# Patient Record
Sex: Female | Born: 1985 | Race: Asian | Hispanic: No | Marital: Married | Smoking: Former smoker
Health system: Southern US, Community
[De-identification: ages and names within clinical notes are randomized; demographics above are authoritative.]

## PROBLEM LIST (undated history)

## (undated) DIAGNOSIS — R569 Unspecified convulsions: Secondary | ICD-10-CM

## (undated) DIAGNOSIS — Z8619 Personal history of other infectious and parasitic diseases: Secondary | ICD-10-CM

## (undated) DIAGNOSIS — R51 Headache: Secondary | ICD-10-CM

## (undated) DIAGNOSIS — D649 Anemia, unspecified: Secondary | ICD-10-CM

## (undated) DIAGNOSIS — F32A Depression, unspecified: Secondary | ICD-10-CM

## (undated) HISTORY — DX: Depression, unspecified: F32.A

## (undated) HISTORY — DX: Anemia, unspecified: D64.9

## (undated) HISTORY — DX: Headache: R51

## (undated) HISTORY — DX: Personal history of other infectious and parasitic diseases: Z86.19

---

## 2007-03-25 ENCOUNTER — Other Ambulatory Visit: Admission: RE | Admit: 2007-03-25 | Discharge: 2007-03-25 | Payer: Self-pay | Admitting: Family Medicine

## 2009-03-11 ENCOUNTER — Emergency Department (HOSPITAL_COMMUNITY): Admission: EM | Admit: 2009-03-11 | Discharge: 2009-03-12 | Payer: Self-pay | Admitting: Emergency Medicine

## 2009-04-25 ENCOUNTER — Emergency Department (HOSPITAL_COMMUNITY): Admission: EM | Admit: 2009-04-25 | Discharge: 2009-04-26 | Payer: Self-pay | Admitting: Emergency Medicine

## 2009-05-06 ENCOUNTER — Emergency Department (HOSPITAL_COMMUNITY): Admission: EM | Admit: 2009-05-06 | Discharge: 2009-05-06 | Payer: Self-pay | Admitting: Emergency Medicine

## 2009-06-01 DIAGNOSIS — D649 Anemia, unspecified: Secondary | ICD-10-CM

## 2009-06-01 DIAGNOSIS — R569 Unspecified convulsions: Secondary | ICD-10-CM | POA: Insufficient documentation

## 2009-06-06 ENCOUNTER — Encounter (INDEPENDENT_AMBULATORY_CARE_PROVIDER_SITE_OTHER): Payer: Self-pay | Admitting: *Deleted

## 2009-06-10 ENCOUNTER — Emergency Department (HOSPITAL_COMMUNITY): Admission: EM | Admit: 2009-06-10 | Discharge: 2009-06-10 | Payer: Self-pay | Admitting: Emergency Medicine

## 2010-03-06 NOTE — Letter (Signed)
Summary: Appointment - Missed   [facility] Main [facility]  [address]   [place], [place] [address]   Phone: [phone]  Fax: [phone]     Jun 06, 2009 MRN: [id number]   Jessica Morgan DOBLER [address] [place], [place]  [address]   Dear [name],  Our records indicate you missed your appointment on 06/02/2009 with Dr. Tenny Craw. It is very important that we reach you to reschedule this appointment. We look forward to participating in your health care needs. Please contact us at the number listed above at your earliest convenience to reschedule this appointment.     Sincerely,   Migdalia Dk [facility] Scheduling Team

## 2010-04-24 LAB — PHENYTOIN LEVEL, TOTAL: Phenytoin Lvl: <2.5 ug/mL — ABNORMAL LOW (ref 10.0–20.0)

## 2010-04-24 LAB — URINALYSIS, ROUTINE W REFLEX MICROSCOPIC
Ketones, ur: NEGATIVE mg/dL
Urobilinogen, UA: 1 mg/dL (ref 0.0–1.0)

## 2010-04-24 LAB — POCT PREGNANCY, URINE: Preg Test, Ur: NEGATIVE

## 2010-04-24 LAB — URINE MICROSCOPIC-ADD ON

## 2010-04-24 LAB — GLUCOSE, CAPILLARY: Glucose-Capillary: 99 mg/dL (ref 70–99)

## 2010-04-25 LAB — DIFFERENTIAL
Eosinophils Absolute: 0.1 10*3/uL (ref 0.0–0.7)
Lymphs Abs: 1 10*3/uL (ref 0.7–4.0)
Neutrophils Relative %: 76 % (ref 43–77)

## 2010-04-25 LAB — PREGNANCY, URINE: Preg Test, Ur: NEGATIVE

## 2010-04-25 LAB — URINE CULTURE: Colony Count: >=100000

## 2010-04-25 LAB — BASIC METABOLIC PANEL
BUN: 14 mg/dL (ref 6–23)
CO2: 23 mEq/L (ref 19–32)
Calcium: 9.1 mg/dL (ref 8.4–10.5)
Chloride: 108 mEq/L (ref 96–112)
Creatinine, Ser: 0.58 mg/dL (ref 0.4–1.2)
GFR calc non Af Amer: >60 mL/min (ref >60)
Potassium: 3.8 mEq/L (ref 3.5–5.1)

## 2010-04-25 LAB — URINALYSIS, ROUTINE W REFLEX MICROSCOPIC
Bilirubin Urine: NEGATIVE
Glucose, UA: NEGATIVE mg/dL
Protein, ur: NEGATIVE mg/dL

## 2010-04-25 LAB — URINE MICROSCOPIC-ADD ON

## 2010-04-25 LAB — CBC
MCV: 79.7 fL (ref 78.0–100.0)
RDW: 14.8 % (ref 11.5–15.5)

## 2010-04-26 LAB — RAPID URINE DRUG SCREEN, HOSP PERFORMED
Benzodiazepines: NOT DETECTED
Opiates: NOT DETECTED

## 2010-04-26 LAB — URINE MICROSCOPIC-ADD ON

## 2010-04-26 LAB — CBC
HCT: 30.7 % — ABNORMAL LOW (ref 36.0–46.0)
Hemoglobin: 9.7 g/dL — ABNORMAL LOW (ref 12.0–15.0)
MCHC: 31.7 g/dL (ref 30.0–36.0)
Platelets: 199 10*3/uL (ref 150–400)
RBC: 3.87 MIL/uL (ref 3.87–5.11)

## 2010-04-26 LAB — BASIC METABOLIC PANEL
CO2: 21 mEq/L (ref 19–32)
Calcium: 8.6 mg/dL (ref 8.4–10.5)
Chloride: 104 mEq/L (ref 96–112)
GFR calc Af Amer: >60 mL/min (ref >60)
GFR calc non Af Amer: >60 mL/min (ref >60)
Glucose, Bld: 109 mg/dL — ABNORMAL HIGH (ref 70–99)
Sodium: 135 mEq/L (ref 135–145)

## 2010-04-26 LAB — URINALYSIS, ROUTINE W REFLEX MICROSCOPIC
Hgb urine dipstick: NEGATIVE
Specific Gravity, Urine: 1.014 (ref 1.005–1.030)

## 2010-04-26 LAB — DIFFERENTIAL
Lymphs Abs: 1.5 10*3/uL (ref 0.7–4.0)
Neutro Abs: 3.8 10*3/uL (ref 1.7–7.7)

## 2010-04-26 LAB — GLUCOSE, CAPILLARY: Glucose-Capillary: 121 mg/dL — ABNORMAL HIGH (ref 70–99)

## 2010-04-26 LAB — ETHANOL: Alcohol, Ethyl (B): 18 mg/dL — ABNORMAL HIGH (ref 0–10)

## 2010-04-30 LAB — POCT I-STAT, CHEM 8
Chloride: 106 mEq/L (ref 96–112)
Glucose, Bld: 91 mg/dL (ref 70–99)
HCT: 30 % — ABNORMAL LOW (ref 36.0–46.0)
Potassium: 4 mEq/L (ref 3.5–5.1)

## 2010-04-30 LAB — URINALYSIS, ROUTINE W REFLEX MICROSCOPIC
Bilirubin Urine: NEGATIVE
Nitrite: NEGATIVE
Specific Gravity, Urine: 1.022 (ref 1.005–1.030)
pH: 7.5 (ref 5.0–8.0)

## 2010-04-30 LAB — URINE MICROSCOPIC-ADD ON

## 2011-02-06 ENCOUNTER — Encounter: Payer: Self-pay | Admitting: Emergency Medicine

## 2011-02-06 ENCOUNTER — Emergency Department (HOSPITAL_COMMUNITY)
Admission: EM | Admit: 2011-02-06 | Discharge: 2011-02-06 | Disposition: A | Discharge status: Home / Self Care | Payer: Self-pay | Attending: Emergency Medicine | Admitting: Emergency Medicine

## 2011-02-06 DIAGNOSIS — F17200 Nicotine dependence, unspecified, uncomplicated: Secondary | ICD-10-CM | POA: Insufficient documentation

## 2011-02-06 DIAGNOSIS — R569 Unspecified convulsions: Secondary | ICD-10-CM | POA: Insufficient documentation

## 2011-02-06 HISTORY — DX: Unspecified convulsions: R56.9

## 2011-02-06 LAB — BASIC METABOLIC PANEL
BUN: 14 mg/dL (ref 6–23)
Calcium: 9 mg/dL (ref 8.4–10.5)
GFR calc non Af Amer: >90 mL/min (ref >90)
Glucose, Bld: 80 mg/dL (ref 70–99)

## 2011-02-06 LAB — CBC
MCH: 23.6 pg — ABNORMAL LOW (ref 26.0–34.0)
MCV: 75.4 fL — ABNORMAL LOW (ref 78.0–100.0)
Platelets: 204 10*3/uL (ref 150–400)
RDW: 14.5 % (ref 11.5–15.5)
WBC: 7 10*3/uL (ref 4.0–10.5)

## 2011-02-06 LAB — DIFFERENTIAL
Basophils Absolute: 0 10*3/uL (ref 0.0–0.1)
Basophils Relative: 0 % (ref 0–1)
Eosinophils Absolute: 0.1 10*3/uL (ref 0.0–0.7)
Eosinophils Relative: 1 % (ref 0–5)
WBC Morphology: INCREASED

## 2011-02-06 NOTE — ED Notes (Signed)
[id number]<AV> Expected date:<BR> Expected time:<BR> Means of arrival:<BR> Comments:<BR> ems/seizure

## 2011-02-06 NOTE — ED Provider Notes (Signed)
History     CSN: [id number]  Arrival date & time 02/06/11  1218   First MD Initiated Contact with Patient 02/06/11 1301      Chief Complaint  Patient presents with  . Febrile Seizure    (Consider location/radiation/quality/duration/timing/severity/associated sxs/prior treatment) Patient is a 26 y.o. female presenting with seizures. The history is provided by the patient.  Seizures  This is a recurrent problem. The current episode started less than 1 hour ago. The problem has been resolved. There was 1 seizure. The most recent episode lasted 2 to 5 minutes. Characteristics include rhythmic jerking. Characteristics do not include bladder incontinence, bit tongue or cyanosis. The episode was witnessed. There was no sensation of an aura present. The seizures did not continue in the ED. The seizure(s) had no focality. There has been no fever.    Past Medical History  Diagnosis Date  . Seizures     History reviewed. No pertinent past surgical history.  No family history on file.  History  Substance Use Topics  . Smoking status: Current Everyday Smoker -- 1.0 packs/day for 5 years    Types: Cigarettes  . Smokeless tobacco: Not on file  . Alcohol Use:      occasional    OB History    Grav Para Term Preterm Abortions TAB SAB Ect Mult Living                  Review of Systems  Cardiovascular: Negative for cyanosis.  Genitourinary: Negative for bladder incontinence.  Neurological: Positive for seizures.  All other systems reviewed and are negative.    Allergies  Review of patient's allergies indicates no known allergies.  Home Medications  No current outpatient prescriptions on file.  BP 110/61  Pulse 80  Temp(Src) 98.9 F (37.2 C) (Oral)  Resp 17  Ht 5\' 2"  (1.575 m)  Wt 153 lb (69.4 kg)  BMI 27.98 kg/m2  SpO2 100%  LMP 01/09/2011  Physical Exam  Nursing note and vitals reviewed. Constitutional: She is oriented to person, place, and time. She appears  well-developed and well-nourished. No distress.  HENT:  Head: Normocephalic and atraumatic.  Neck: Normal range of motion. Neck supple.  Cardiovascular: Normal rate and regular rhythm.  Exam reveals no gallop and no friction rub.   No murmur heard. Pulmonary/Chest: Effort normal and breath sounds normal. No respiratory distress. She has no wheezes.  Abdominal: Soft. Bowel sounds are normal. She exhibits no distension. There is no tenderness.  Musculoskeletal: Normal range of motion.  Neurological: She is alert and oriented to person, place, and time. No cranial nerve deficit. Coordination normal.  Skin: Skin is warm and dry. She is not diaphoretic.    ED Course  Procedures (including critical care time)  Labs Reviewed - No data to display No results found.   No diagnosis found.    MDM  Seizure resolved prior to arrival.  The Labs are okay.  She was on dilantin but this was stopped by a physician at [facility] several months, maybe a year ago.  It seems to me that she needs to be on a seizure medication.  She should follow up with her neurologist in the next week.  I instructed her to call tomorrow to make an appointment.        Geoffery Lyons, MD 02/06/11 [phone]

## 2011-02-06 NOTE — ED Notes (Signed)
Per EMS , pt.had grand mal seizure  At 11:45am, pt. Was found confused and responsive to painful stimuli.

## 2011-06-17 ENCOUNTER — Emergency Department (HOSPITAL_COMMUNITY)
Admission: EM | Admit: 2011-06-17 | Discharge: 2011-06-17 | Disposition: A | Discharge status: Home / Self Care | Payer: Self-pay | Attending: Emergency Medicine | Admitting: Emergency Medicine

## 2011-06-17 ENCOUNTER — Encounter (HOSPITAL_COMMUNITY): Payer: Self-pay | Admitting: Emergency Medicine

## 2011-06-17 DIAGNOSIS — R569 Unspecified convulsions: Secondary | ICD-10-CM

## 2011-06-17 DIAGNOSIS — D649 Anemia, unspecified: Secondary | ICD-10-CM | POA: Insufficient documentation

## 2011-06-17 DIAGNOSIS — G40909 Epilepsy, unspecified, not intractable, without status epilepticus: Secondary | ICD-10-CM | POA: Insufficient documentation

## 2011-06-17 LAB — POCT I-STAT, CHEM 8
BUN: 16 mg/dL (ref 6–23)
Calcium, Ion: 1.21 mmol/L (ref 1.12–1.32)
Creatinine, Ser: 0.8 mg/dL (ref 0.50–1.10)
Glucose, Bld: 84 mg/dL (ref 70–99)
TCO2: 24 mmol/L (ref 0–100)

## 2011-06-17 LAB — URINALYSIS, ROUTINE W REFLEX MICROSCOPIC
Ketones, ur: NEGATIVE mg/dL
Protein, ur: NEGATIVE mg/dL
Urobilinogen, UA: 1 mg/dL (ref 0.0–1.0)

## 2011-06-17 LAB — CBC
Hemoglobin: 8.9 g/dL — ABNORMAL LOW (ref 12.0–15.0)
MCH: 23.5 pg — ABNORMAL LOW (ref 26.0–34.0)
MCHC: 31.2 g/dL (ref 30.0–36.0)
Platelets: 138 10*3/uL — ABNORMAL LOW (ref 150–400)

## 2011-06-17 LAB — URINE MICROSCOPIC-ADD ON

## 2011-06-17 LAB — PREGNANCY, URINE: Preg Test, Ur: NEGATIVE

## 2011-06-17 LAB — DIFFERENTIAL
Basophils Relative: 0 % (ref 0–1)
Eosinophils Absolute: 0.1 10*3/uL (ref 0.0–0.7)
Eosinophils Relative: 2 % (ref 0–5)
Monocytes Relative: 4 % (ref 3–12)
Neutrophils Relative %: 71 % (ref 43–77)

## 2011-06-17 MED ORDER — POTASSIUM CHLORIDE CRYS ER 20 MEQ PO TBCR
20.0000 meq | EXTENDED_RELEASE_TABLET | Freq: Once | ORAL | Status: AC
  Administered 2011-06-17: 20 meq via ORAL
  Filled 2011-06-17: qty 1

## 2011-06-17 MED ORDER — SODIUM CHLORIDE 0.9 % IV BOLUS (SEPSIS)
1000.0000 mL | Freq: Once | INTRAVENOUS | Status: AC
  Administered 2011-06-17: 1000 mL via INTRAVENOUS

## 2011-06-17 NOTE — ED Notes (Signed)
Old and New EKG given to DR Lynelle Doctor
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
## 2011-06-17 NOTE — Discharge Instructions (Signed)
Seizure, Adult A seizure is when the body shakes uncontrollably (convulsion). It can be a scary experience. A seizure is not a diagnosis. It is a sign that something else may be wrong with brain and/or spinal cord (central nervous system). In the Emergency Department, your condition is evaluated. The seizure is then treated. You will likely need follow-up with your caregiver. You will possibly need further testing and evaluation. Your caregiver or the specialist to whom you are referred will determine if further treatment is needed. After a seizure, you may be confused, dazed and drowsy. These problems (symptoms) often follow a seizure. Medication given to treat the seizure may also cause some of these changes. The time following a seizure is known as a refractory period. Hospital admission is seldom required unless there are other conditions present such as trauma or metabolic problems. Sometimes the seizure activity follows a fainting episode. This may have been caused by a brief drop in blood pressure. These fainting (syncopal) seizures are generally not a cause for concern.  HOME CARE INSTRUCTIONS   Follow up with your caregiver as suggested.   If any problems happen, get help right away.   Do not swim or drive until your caregiver says it is okay.  Document Released: 01/19/2000 Document Revised: 01/10/2011 Document Reviewed: 01/09/2011 [facility] Patient Information 2012 [place], [place].Anemia, Frequently Asked Questions WHAT ARE THE SYMPTOMS OF ANEMIA?  Headache.   Difficulty thinking.   Fatigue.   Shortness of breath.   Weakness.   Rapid heartbeat.  AT WHAT POINT ARE PEOPLE CONSIDERED ANEMIC?  This varies with gender and age.   Both hemoglobin (Hgb) and hematocrit values are used to define anemia. These lab values are obtained from a complete blood count (CBC) test. This is performed at a caregiver's office.   The normal range of hemoglobin values for adult men is 14.0 g/dL to  16.1 g/dL. For nonpregnant women, values are 12.3 g/dL to 09.6 g/dL.   The World Health Organization defines anemia as less than 12 g/dL for nonpregnant women and less than 13 g/dL for men.   For adult males, the average normal hematocrit is 46%, and the range is 40% to 52%.   For adult females, the average normal hematocrit is 41%, and the range is 35% to 47%.   Values that fall below the lower limits can be a sign of anemia and should have further checking (evaluation).  GROUPS OF PEOPLE WHO ARE AT RISK FOR DEVELOPING ANEMIA INCLUDE:   Infants who are breastfed or taking a formula that is not fortified with iron.   Children going through a rapid growth spurt. The iron available can not keep up with the needs for a red cell mass which must grow with the child.   Women in childbearing years. They need iron because of blood loss during menstruation.   Pregnant women. The growing fetus creates a high demand for iron.   People with ongoing gastrointestinal blood loss are at risk of developing iron deficiency.   Individuals with leukemia or cancer who must receive chemotherapy or radiation to treat their disease. The drugs or radiation used to treat these diseases often decreases the bone marrow's ability to make cells of all classes. This includes red blood cells, white blood cells, and platelets.   Individuals with chronic inflammatory conditions such as rheumatoid arthritis or chronic infections.   The elderly.  ARE SOME TYPES OF ANEMIA INHERITED?   Yes, some types of anemia are due to inherited or  genetic defects.   Sickle cell anemia. This occurs most often in people of African, African American, and Mediterranean descent.   Thalassemia (or Cooley's anemia). This type is found in people of Mediterranean and Southeast Asian descent. These types of anemia are common.   [place]. This is rare.  CAN CERTAIN MEDICATIONS CAUSE A PERSON TO BECOME ANEMIC?  Yes. For example, drugs to fight  cancer (chemotherapeutic agents) often cause anemia. These drugs can slow the bone marrow's ability to make red blood cells. If there are not enough red blood cells, the body does not get enough oxygen. WHAT HEMATOCRIT LEVEL IS REQUIRED TO DONATE BLOOD?  The lower limit of an acceptable hematocrit for blood donors is 38%. If you have a low hematocrit value, you should schedule an appointment with your caregiver. ARE BLOOD TRANSFUSIONS COMMONLY USED TO CORRECT ANEMIA, AND ARE THEY DANGEROUS?  They are used to treat anemia as a last resort. Your caregiver will find the cause of the anemia and correct it if possible. Most blood transfusions are given because of excessive bleeding at the time of surgery, with trauma, or because of bone marrow suppression in patients with cancer or leukemia on chemotherapy. Blood transfusions are safer than ever before. We also know that blood transfusions affect the immune system and may increase certain risks. There is also a concern for human error. In 1/16,000 transfusions, a patient receives a transfusion of blood that is not matched with his or her blood type.  WHAT IS IRON DEFICIENCY ANEMIA AND CAN I CORRECT IT BY CHANGING MY DIET?  Iron is an essential part of hemoglobin. Without enough hemoglobin, anemia develops and the body does not get the right amount of oxygen. Iron deficiency anemia develops after the body has had a low level of iron for a long time. This is either caused by blood loss, not taking in or absorbing enough iron, or increased demands for iron (like pregnancy or rapid growth).  Foods from animal origin such as beef, chicken, and pork, are good sources of iron. Be sure to have one of these foods at each meal. Vitamin C helps your body absorb iron. Foods rich in Vitamin C include citrus, bell pepper, strawberries, spinach and cantaloupe. In some cases, iron supplements may be needed in order to correct the iron deficiency. In the case of poor absorption,  extra iron may have to be given directly into the vein through a needle (intravenously). I HAVE BEEN DIAGNOSED WITH IRON DEFICIENCY ANEMIA AND MY CAREGIVER PRESCRIBED IRON SUPPLEMENTS. HOW LONG WILL IT TAKE FOR MY BLOOD TO BECOME NORMAL?  It depends on the degree of anemia at the beginning of treatment. Most people with mild to moderate iron deficiency, anemia will correct the anemia over a period of 2 to 3 months. But after the anemia is corrected, the iron stored by the body is still low. Caregivers often suggest an additional 6 months of oral iron therapy once the anemia has been reversed. This will help prevent the iron deficiency anemia from quickly happening again. Non-anemic adult males should take iron supplements only under the direction of a doctor, too much iron can cause liver damage.  MY HEMOGLOBIN IS 9 G/DL AND I AM SCHEDULED FOR SURGERY. SHOULD I POSTPONE THE SURGERY?  If you have Hgb of 9, you should discuss this with your caregiver right away. Many patients with similar hemoglobin levels have had surgery without problems. If minimal blood loss is expected for a minor procedure, no treatment  may be necessary.  If a greater blood loss is expected for more extensive procedures, you should ask your caregiver about being treated with erythropoietin and iron. This is to accelerate the recovery of your hemoglobin to a normal level before surgery. An anemic patient who undergoes high-blood-loss surgery has a greater risk of surgical complications and need for a blood transfusion, which also carries some risk.  I HAVE BEEN TOLD THAT HEAVY MENSTRUAL PERIODS CAUSE ANEMIA. IS THERE ANYTHING I CAN DO TO PREVENT THE ANEMIA?  Anemia that results from heavy periods is usually due to iron deficiency. You can try to meet the increased demands for iron caused by the heavy monthly blood loss by increasing the intake of iron-rich foods. Iron supplements may be required. Discuss your concerns with your  caregiver. WHAT CAUSES ANEMIA DURING PREGNANCY?  Pregnancy places major demands on the body. The mother must meet the needs of both her body and her growing baby. The body needs enough iron and folate to make the right amount of red blood cells. To prevent anemia while pregnant, the mother should stay in close contact with her caregiver.  Be sure to eat a diet that has foods rich in iron and folate like liver and dark green leafy vegetables. Folate plays an important role in the normal development of a baby's spinal cord. Folate can help prevent serious disorders like spina bifida. If your diet does not provide adequate nutrients, you may want to talk with your caregiver about nutritional supplements.  WHAT IS THE RELATIONSHIP BETWEEN FIBROID TUMORS AND ANEMIA IN WOMEN?  The relationship is usually caused by the increased menstrual blood loss caused by fibroids. Good iron intake may be required to prevent iron deficiency anemia from developing.  Document Released: 08/30/2003 Document Revised: 01/10/2011 Document Reviewed: 02/13/2010 [facility] Patient Information 2012 [place], [place].

## 2011-06-17 NOTE — ED Provider Notes (Signed)
History     CSN: [id number]  Arrival date & time 06/17/11  2016   First MD Initiated Contact with Patient 06/17/11 2020      Chief Complaint  Patient presents with  . Loss of Consciousness    (Consider location/radiation/quality/duration/timing/severity/associated sxs/prior treatment) HPI Pt reports she thinks she had a seizure while at work just prior to arrival. [place] she has had similar episodes in the past but is not taking any medications. She states she was found on the floor of the bathroom but is unsure how she got there. She denies any complaints now. Denies any tongue biting or incontinence. No headache, blurry vision, nausea, vomiting or diarrhea.  No dysuria or hematuria. No vaginal complaints.  Past Medical History  Diagnosis Date  . Seizures     History reviewed. No pertinent past surgical history.  History reviewed. No pertinent family history.  History  Substance Use Topics  . Smoking status: Current Everyday Smoker -- 1.0 packs/day for 5 years    Types: Cigarettes  . Smokeless tobacco: Not on file  . Alcohol Use:      occasional    OB History    Grav Para Term Preterm Abortions TAB SAB Ect Mult Living                  Review of Systems All other systems reviewed and are negative except as noted in HPI.   Allergies  Review of patient's allergies indicates no known allergies.  Home Medications   Current Outpatient Rx  Name Route Sig Dispense Refill  . IBUPROFEN 200 MG PO TABS Oral Take 200 mg by mouth every 6 (six) hours as needed. For pain      BP 118/76  Pulse 76  Temp(Src) 99.3 F (37.4 C) (Oral)  Resp 14  SpO2 97%  Physical Exam  Nursing note and vitals reviewed. Constitutional: She is oriented to person, place, and time. She appears well-developed and well-nourished.  HENT:  Head: Normocephalic and atraumatic.  Eyes: EOM are normal. Pupils are equal, round, and reactive to light.  Neck: Normal range of motion. Neck supple.    Cardiovascular: Normal rate, normal heart sounds and intact distal pulses.   Pulmonary/Chest: Effort normal and breath sounds normal.  Abdominal: Bowel sounds are normal. She exhibits no distension. There is no tenderness.  Musculoskeletal: Normal range of motion. She exhibits no edema and no tenderness.  Neurological: She is alert and oriented to person, place, and time. She has normal strength. No cranial nerve deficit or sensory deficit.  Skin: Skin is warm and dry. No rash noted.  Psychiatric: She has a normal mood and affect.    ED Course  Procedures (including critical care time)  Labs Reviewed  CBC - Abnormal; Notable for the following:    RBC 3.78 (*)    Hemoglobin 8.9 (*)    HCT 28.5 (*)    MCV 75.4 (*)    MCH 23.5 (*)    Platelets 138 (*)    All other components within normal limits  POCT I-STAT, CHEM 8 - Abnormal; Notable for the following:    Potassium 3.3 (*)    Hemoglobin 9.5 (*)    HCT 28.0 (*)    All other components within normal limits  DIFFERENTIAL  PREGNANCY, URINE  URINALYSIS, ROUTINE W REFLEX MICROSCOPIC   No results found.   1. SEIZURE DISORDER   2. ANEMIA       MDM   Date: 06/17/2011  Rate: 87  Rhythm: normal sinus rhythm  QRS Axis: normal  Intervals: normal  ST/T Wave abnormalities: normal  Conduction Disutrbances:none  Narrative Interpretation:   Old EKG Reviewed: unchanged  Seizure in patient with reported history of same. Plan basic lab eval and if neg advised to followup with Neurologist for long term control.         Kadesia Robel B. Bernette Mayers, MD 06/17/11 2303

## 2012-02-04 ENCOUNTER — Encounter (HOSPITAL_COMMUNITY): Payer: Self-pay | Admitting: *Deleted

## 2012-02-04 ENCOUNTER — Emergency Department (HOSPITAL_COMMUNITY)
Admission: EM | Admit: 2012-02-04 | Discharge: 2012-02-04 | Disposition: A | Discharge status: Home / Self Care | Payer: Self-pay | Attending: Emergency Medicine | Admitting: Emergency Medicine

## 2012-02-04 DIAGNOSIS — F445 Conversion disorder with seizures or convulsions: Secondary | ICD-10-CM

## 2012-02-04 DIAGNOSIS — F17200 Nicotine dependence, unspecified, uncomplicated: Secondary | ICD-10-CM | POA: Insufficient documentation

## 2012-02-04 DIAGNOSIS — R569 Unspecified convulsions: Secondary | ICD-10-CM | POA: Insufficient documentation

## 2012-02-04 DIAGNOSIS — Z3202 Encounter for pregnancy test, result negative: Secondary | ICD-10-CM | POA: Insufficient documentation

## 2012-02-04 HISTORY — DX: Unspecified convulsions: R56.9

## 2012-02-04 NOTE — ED Notes (Signed)
Bed:RESA<BR> Expected date:<BR> Expected time:<BR> Means of arrival:<BR> Comments:<BR> seizure
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
## 2012-02-06 ENCOUNTER — Encounter (HOSPITAL_COMMUNITY): Payer: Self-pay | Admitting: Emergency Medicine

## 2012-02-07 NOTE — Progress Notes (Signed)
During 02/04/12 [facility] ED visit --Pt listed with no insurance coverage CM and [facility] liaison spoke with pt.  Pt offered services to assist with finding a [place] self pay provider & health reform information

## 2012-12-23 LAB — OB RESULTS CONSOLE GC/CHLAMYDIA
Chlamydia: NEGATIVE
Gonorrhea: NEGATIVE

## 2012-12-23 LAB — OB RESULTS CONSOLE ABO/RH: RH TYPE: POSITIVE

## 2012-12-23 LAB — OB RESULTS CONSOLE ANTIBODY SCREEN: ANTIBODY SCREEN: NEGATIVE

## 2012-12-23 LAB — OB RESULTS CONSOLE RUBELLA ANTIBODY, IGM: RUBELLA: IMMUNE

## 2012-12-23 LAB — OB RESULTS CONSOLE RPR: RPR: NONREACTIVE

## 2012-12-23 LAB — OB RESULTS CONSOLE HIV ANTIBODY (ROUTINE TESTING): HIV: NONREACTIVE

## 2012-12-23 LAB — OB RESULTS CONSOLE HEPATITIS B SURFACE ANTIGEN: HEP B S AG: NEGATIVE

## 2013-02-04 NOTE — L&D Delivery Note (Signed)
Delivery Note At 8:29 PM a viable female was delivered via Vaginal, Spontaneous Delivery (Presentation: Left Occiput Anterior).  APGAR: 7, 9; weight pending.   Placenta status: Intact, .  Cord: 3 vessels with the following complications: None.  Anesthesia: Epidural  Episiotomy: None Lacerations: 1st degree Suture Repair: 3.0 vicryl rapide Est. Blood Loss (mL): 500  Mom to postpartum.  Baby to Couplet care / Skin to Skin.  Rhyse Loux 06/02/2013, 8:45 PM

## 2013-05-05 LAB — OB RESULTS CONSOLE GBS: GBS: NEGATIVE

## 2013-05-21 ENCOUNTER — Encounter (HOSPITAL_COMMUNITY): Payer: Self-pay | Admitting: *Deleted

## 2013-05-21 ENCOUNTER — Telehealth (HOSPITAL_COMMUNITY): Payer: Self-pay | Admitting: *Deleted

## 2013-05-21 NOTE — Telephone Encounter (Signed)
Preadmission screen  

## 2013-06-02 ENCOUNTER — Inpatient Hospital Stay (HOSPITAL_COMMUNITY): Payer: Medicaid Other | Admitting: Anesthesiology

## 2013-06-02 ENCOUNTER — Encounter (HOSPITAL_COMMUNITY): Payer: Self-pay

## 2013-06-02 ENCOUNTER — Inpatient Hospital Stay (HOSPITAL_COMMUNITY)
Admission: RE | Admit: 2013-06-02 | Discharge: 2013-06-04 | DRG: 775 | Disposition: A | Discharge status: Home / Self Care | Payer: Medicaid Other | Source: Ambulatory Visit | Attending: Obstetrics and Gynecology | Admitting: Obstetrics and Gynecology

## 2013-06-02 ENCOUNTER — Encounter (HOSPITAL_COMMUNITY): Payer: Medicaid Other | Admitting: Anesthesiology

## 2013-06-02 DIAGNOSIS — D649 Anemia, unspecified: Secondary | ICD-10-CM | POA: Diagnosis present

## 2013-06-02 DIAGNOSIS — O9902 Anemia complicating childbirth: Secondary | ICD-10-CM | POA: Diagnosis present

## 2013-06-02 DIAGNOSIS — Z3400 Encounter for supervision of normal first pregnancy, unspecified trimester: Secondary | ICD-10-CM

## 2013-06-02 DIAGNOSIS — O99334 Smoking (tobacco) complicating childbirth: Secondary | ICD-10-CM | POA: Diagnosis present

## 2013-06-02 LAB — RPR

## 2013-06-02 LAB — CBC
HCT: 26.4 % — ABNORMAL LOW (ref 36.0–46.0)
HEMOGLOBIN: 8.1 g/dL — AB (ref 12.0–15.0)
MCH: 25.6 pg — AB (ref 26.0–34.0)
MCHC: 30.7 g/dL (ref 30.0–36.0)
MCV: 83.5 fL (ref 78.0–100.0)
Platelets: 120 10*3/uL — ABNORMAL LOW (ref 150–400)
RBC: 3.16 MIL/uL — ABNORMAL LOW (ref 3.87–5.11)
RDW: 14.5 % (ref 11.5–15.5)
WBC: 9 10*3/uL (ref 4.0–10.5)

## 2013-06-02 LAB — TYPE AND SCREEN
ABO/RH(D): B POS
Antibody Screen: NEGATIVE

## 2013-06-02 LAB — ABO/RH: ABO/RH(D): B POS

## 2013-06-02 MED ORDER — OXYTOCIN 40 UNITS IN LACTATED RINGERS INFUSION - SIMPLE MED: 62.5000 mL/h | INTRAVENOUS | Status: DC

## 2013-06-02 MED ORDER — LACTATED RINGERS IV SOLN
500.0000 mL | Freq: Once | INTRAVENOUS | Status: AC
  Administered 2013-06-02: 500 mL via INTRAVENOUS

## 2013-06-02 MED ORDER — METHYLERGONOVINE MALEATE 0.2 MG/ML IJ SOLN: 0.2000 mg | INTRAMUSCULAR | Status: DC | PRN

## 2013-06-02 MED ORDER — FENTANYL 2.5 MCG/ML BUPIVACAINE 1/10 % EPIDURAL INFUSION (WH - ANES)
INTRAMUSCULAR | Status: AC
  Filled 2013-06-02: qty 125

## 2013-06-02 MED ORDER — SENNOSIDES-DOCUSATE SODIUM 8.6-50 MG PO TABS
2.0000 | ORAL_TABLET | ORAL | Status: DC
  Administered 2013-06-02 – 2013-06-03 (×2): 2 via ORAL
  Filled 2013-06-02 (×2): qty 2

## 2013-06-02 MED ORDER — ONDANSETRON HCL 4 MG PO TABS: 4.0000 mg | ORAL_TABLET | ORAL | Status: DC | PRN

## 2013-06-02 MED ORDER — OXYCODONE-ACETAMINOPHEN 5-325 MG PO TABS: 1.0000 | ORAL_TABLET | ORAL | Status: DC | PRN

## 2013-06-02 MED ORDER — PHENYLEPHRINE 40 MCG/ML (10ML) SYRINGE FOR IV PUSH (FOR BLOOD PRESSURE SUPPORT)
80.0000 ug | PREFILLED_SYRINGE | INTRAVENOUS | Status: DC | PRN
  Filled 2013-06-02: qty 10
  Filled 2013-06-02: qty 2

## 2013-06-02 MED ORDER — WITCH HAZEL-GLYCERIN EX PADS: 1.0000 "application " | MEDICATED_PAD | CUTANEOUS | Status: DC | PRN

## 2013-06-02 MED ORDER — OXYTOCIN 40 UNITS IN LACTATED RINGERS INFUSION - SIMPLE MED: 1.0000 m[IU]/min | INTRAVENOUS | Status: DC

## 2013-06-02 MED ORDER — CITRIC ACID-SODIUM CITRATE 334-500 MG/5ML PO SOLN: 30.0000 mL | ORAL | Status: DC | PRN

## 2013-06-02 MED ORDER — PHENYLEPHRINE 40 MCG/ML (10ML) SYRINGE FOR IV PUSH (FOR BLOOD PRESSURE SUPPORT)
PREFILLED_SYRINGE | INTRAVENOUS | Status: AC
  Filled 2013-06-02: qty 10

## 2013-06-02 MED ORDER — OXYTOCIN 40 UNITS IN LACTATED RINGERS INFUSION - SIMPLE MED
1.0000 m[IU]/min | INTRAVENOUS | Status: DC
  Administered 2013-06-02: 1 m[IU]/min via INTRAVENOUS

## 2013-06-02 MED ORDER — FENTANYL 2.5 MCG/ML BUPIVACAINE 1/10 % EPIDURAL INFUSION (WH - ANES)
14.0000 mL/h | INTRAMUSCULAR | Status: DC | PRN
  Filled 2013-06-02: qty 125

## 2013-06-02 MED ORDER — ACETAMINOPHEN 325 MG PO TABS: 650.0000 mg | ORAL_TABLET | ORAL | Status: DC | PRN

## 2013-06-02 MED ORDER — LIDOCAINE HCL (PF) 1 % IJ SOLN
INTRAMUSCULAR | Status: DC | PRN
  Administered 2013-06-02 (×2): 5 mL

## 2013-06-02 MED ORDER — OXYTOCIN 40 UNITS IN LACTATED RINGERS INFUSION - SIMPLE MED
1.0000 m[IU]/min | INTRAVENOUS | Status: DC
  Administered 2013-06-02: 2 m[IU]/min via INTRAVENOUS
  Filled 2013-06-02: qty 1000

## 2013-06-02 MED ORDER — LACTATED RINGERS IV SOLN
INTRAVENOUS | Status: DC
  Administered 2013-06-02: 250 mL via INTRAUTERINE

## 2013-06-02 MED ORDER — BENZOCAINE-MENTHOL 20-0.5 % EX AERO
1.0000 "application " | INHALATION_SPRAY | CUTANEOUS | Status: DC | PRN
  Administered 2013-06-02: 1 via TOPICAL
  Filled 2013-06-02: qty 56

## 2013-06-02 MED ORDER — OXYTOCIN BOLUS FROM INFUSION: 500.0000 mL | INTRAVENOUS | Status: DC

## 2013-06-02 MED ORDER — ZOLPIDEM TARTRATE 5 MG PO TABS: 5.0000 mg | ORAL_TABLET | Freq: Every evening | ORAL | Status: DC | PRN

## 2013-06-02 MED ORDER — EPHEDRINE 5 MG/ML INJ
10.0000 mg | INTRAVENOUS | Status: DC | PRN
  Filled 2013-06-02: qty 2

## 2013-06-02 MED ORDER — IBUPROFEN 600 MG PO TABS
600.0000 mg | ORAL_TABLET | Freq: Four times a day (QID) | ORAL | Status: DC
  Administered 2013-06-03 – 2013-06-04 (×6): 600 mg via ORAL
  Filled 2013-06-02 (×6): qty 1

## 2013-06-02 MED ORDER — ONDANSETRON HCL 4 MG/2ML IJ SOLN
4.0000 mg | Freq: Four times a day (QID) | INTRAMUSCULAR | Status: DC | PRN
  Administered 2013-06-02: 4 mg via INTRAVENOUS

## 2013-06-02 MED ORDER — TERBUTALINE SULFATE 1 MG/ML IJ SOLN: 0.2500 mg | Freq: Once | INTRAMUSCULAR | Status: DC | PRN

## 2013-06-02 MED ORDER — LACTATED RINGERS IV SOLN
INTRAVENOUS | Status: DC
  Administered 2013-06-02 (×3): via INTRAVENOUS

## 2013-06-02 MED ORDER — MEASLES, MUMPS & RUBELLA VAC ~~LOC~~ INJ: 0.5000 mL | INJECTION | Freq: Once | SUBCUTANEOUS | Status: DC

## 2013-06-02 MED ORDER — TETANUS-DIPHTH-ACELL PERTUSSIS 5-2.5-18.5 LF-MCG/0.5 IM SUSP
0.5000 mL | Freq: Once | INTRAMUSCULAR | Status: AC
  Administered 2013-06-03: 0.5 mL via INTRAMUSCULAR
  Filled 2013-06-02: qty 0.5

## 2013-06-02 MED ORDER — MAGNESIUM HYDROXIDE 400 MG/5ML PO SUSP: 30.0000 mL | ORAL | Status: DC | PRN

## 2013-06-02 MED ORDER — LIDOCAINE HCL (PF) 1 % IJ SOLN
30.0000 mL | INTRAMUSCULAR | Status: DC | PRN
  Filled 2013-06-02: qty 30

## 2013-06-02 MED ORDER — ONDANSETRON HCL 4 MG/2ML IJ SOLN: 4.0000 mg | INTRAMUSCULAR | Status: DC | PRN

## 2013-06-02 MED ORDER — METHYLERGONOVINE MALEATE 0.2 MG PO TABS: 0.2000 mg | ORAL_TABLET | ORAL | Status: DC | PRN

## 2013-06-02 MED ORDER — LACTATED RINGERS IV SOLN
500.0000 mL | INTRAVENOUS | Status: DC | PRN
  Administered 2013-06-02: 500 mL via INTRAVENOUS

## 2013-06-02 MED ORDER — DIPHENHYDRAMINE HCL 25 MG PO CAPS: 25.0000 mg | ORAL_CAPSULE | Freq: Four times a day (QID) | ORAL | Status: DC | PRN

## 2013-06-02 MED ORDER — DIPHENHYDRAMINE HCL 50 MG/ML IJ SOLN: 12.5000 mg | INTRAMUSCULAR | Status: DC | PRN

## 2013-06-02 MED ORDER — FENTANYL 2.5 MCG/ML BUPIVACAINE 1/10 % EPIDURAL INFUSION (WH - ANES)
14.0000 mL/h | INTRAMUSCULAR | Status: DC | PRN
  Administered 2013-06-02 (×2): 14 mL/h via EPIDURAL

## 2013-06-02 MED ORDER — SODIUM BICARBONATE 8.4 % IV SOLN
INTRAVENOUS | Status: DC | PRN
  Administered 2013-06-02: 5 mL via EPIDURAL

## 2013-06-02 MED ORDER — DIBUCAINE 1 % RE OINT
1.0000 "application " | TOPICAL_OINTMENT | RECTAL | Status: DC | PRN
  Filled 2013-06-02: qty 28

## 2013-06-02 MED ORDER — LANOLIN HYDROUS EX OINT: TOPICAL_OINTMENT | CUTANEOUS | Status: DC | PRN

## 2013-06-02 MED ORDER — IBUPROFEN 600 MG PO TABS
600.0000 mg | ORAL_TABLET | Freq: Four times a day (QID) | ORAL | Status: DC | PRN
  Administered 2013-06-02: 600 mg via ORAL
  Filled 2013-06-02: qty 1

## 2013-06-02 MED ORDER — PRENATAL MULTIVITAMIN CH
1.0000 | ORAL_TABLET | Freq: Every day | ORAL | Status: DC
  Administered 2013-06-03: 1 via ORAL
  Filled 2013-06-02 (×2): qty 1

## 2013-06-02 MED ORDER — SIMETHICONE 80 MG PO CHEW
80.0000 mg | CHEWABLE_TABLET | ORAL | Status: DC | PRN
Start: 2013-06-02 — End: 2013-06-04

## 2013-06-02 MED ORDER — PHENYLEPHRINE 40 MCG/ML (10ML) SYRINGE FOR IV PUSH (FOR BLOOD PRESSURE SUPPORT)
80.0000 ug | PREFILLED_SYRINGE | INTRAVENOUS | Status: DC | PRN
  Filled 2013-06-02: qty 2

## 2013-06-02 MED ORDER — EPHEDRINE 5 MG/ML INJ
INTRAVENOUS | Status: AC
  Filled 2013-06-02: qty 4

## 2013-06-02 NOTE — Progress Notes (Signed)
Comfortable with epidural Afeb, VSS FHT- Cat II, recurrent variable decels, ctx q 2-3 min on 8 mu/min pitocin VE-7-8/80/0, vtx, IUPC and FSE placed Will decrease pitocin and try amnioinfusion for variable decels, making good progress

## 2013-06-02 NOTE — Progress Notes (Signed)
After MD reviewed strip, called me and instructed to leave Pitocin on for now. He is coming to place internal monitors.

## 2013-06-02 NOTE — Anesthesia Procedure Notes (Signed)
Epidural Patient location during procedure: OB Start time: 06/02/2013 9:00 AM  Staffing Anesthesiologist: Brayton CavesJACKSON, Dawn Convery Performed by: anesthesiologist   Preanesthetic Checklist Completed: patient identified, site marked, surgical consent, pre-op evaluation, timeout performed, IV checked, risks and benefits discussed and monitors and equipment checked  Epidural Patient position: sitting Prep: site prepped and draped and DuraPrep Patient monitoring: continuous pulse ox and blood pressure Approach: midline Location: L3-L4 Injection technique: LOR air  Needle:  Needle type: Tuohy  Needle gauge: 17 G Needle length: 9 cm and 9 Needle insertion depth: 5 cm cm Catheter type: closed end flexible Catheter size: 19 Gauge Catheter at skin depth: 10 cm Test dose: negative  Assessment Events: blood not aspirated, injection not painful, no injection resistance, negative IV test and no paresthesia  Additional Notes Patient identified.  Risk benefits discussed including failed block, incomplete pain control, headache, nerve damage, paralysis, blood pressure changes, nausea, vomiting, reactions to medication both toxic or allergic, and postpartum back pain.  Patient expressed understanding and wished to proceed.  All questions were answered.  Sterile technique used throughout procedure and epidural site dressed with sterile barrier dressing. No paresthesia or other complications noted.The patient did not experience any signs of intravascular injection such as tinnitus or metallic taste in mouth nor signs of intrathecal spread such as rapid motor block. Please see nursing notes for vital signs.

## 2013-06-02 NOTE — H&P (Signed)
Jessica KaufmanSarem Morgan is a 28 y.o. female, G1P0, EGA 39+ weeks with EDC 5-1 presenting for elective induction with favorable cervix.  Prenatal care uncomplicated, see prenatal records for complete history.  Maternal Medical History:  Fetal activity: Perceived fetal activity is normal.    Prenatal complications: no prenatal complications   OB History   Grav Para Term Preterm Abortions TAB SAB Ect Mult Living   1              Past Medical History  Diagnosis Date  . Seizures   . Seizure   . Hx of chlamydia infection   . Headache(784.0)    History reviewed. No pertinent past surgical history. Family History: family history is not on file. Social History:  reports that she has been smoking Cigarettes.  She has a 5 pack-year smoking history. She does not have any smokeless tobacco history on file. She reports that she does not drink alcohol or use illicit drugs.   Prenatal Transfer Tool  Maternal Diabetes: No Genetic Screening: Normal Maternal Ultrasounds/Referrals: Normal Fetal Ultrasounds or other Referrals:  None Maternal Substance Abuse:  No Significant Maternal Medications:  None Significant Maternal Lab Results:  Lab values include: Group B Strep negative Other Comments:  None  Review of Systems  Respiratory: Negative.   Cardiovascular: Negative.    AROM-clear Dilation: 4 Effacement (%): 70 Station: -1 Exam by:: Dr. Jackelyn KnifeMeisinger Blood pressure 99/53, pulse 98, temperature 98.4 F (36.9 C), temperature source Oral, resp. rate 22, height 5\' 1"  (1.549 m), weight 83.462 kg (184 lb), last menstrual period 08/27/2012. Maternal Exam:  Uterine Assessment: Contraction strength is mild.  Contraction frequency is irregular.   Abdomen: Patient reports no abdominal tenderness. Estimated fetal weight is 7 1/2 lbs.   Fetal presentation: vertex  Introitus: Normal vulva. Normal vagina.  Amniotic fluid character: clear.  Pelvis: adequate for delivery.   Cervix: Cervix evaluated by digital  exam.     Fetal Exam Fetal Monitor Review: Mode: ultrasound.   Baseline rate: 140.  Variability: moderate (6-25 bpm).   Pattern: accelerations present and no decelerations.    Fetal State Assessment: Category I - tracings are normal.     Physical Exam  Constitutional: She appears well-developed and well-nourished.  Cardiovascular: Normal rate, regular rhythm and normal heart sounds.   No murmur heard. Respiratory: Effort normal and breath sounds normal. No respiratory distress. She has no wheezes.  GI: Soft.  gravid    Prenatal labs: ABO, Rh: --/--/B POS (04/29 16100640) Antibody: PENDING (04/29 0640) Rubella: Immune (11/19 0000) RPR: Nonreactive (11/19 0000)  HBsAg: Negative (11/19 0000)  HIV: Non-reactive (11/19 0000)  GBS: Negative (04/01 0000)  GCT:  107  Assessment/Plan: IUP at 39+ weeks with favorable cervix for induction.  On pitocin, AROM done, monitor progress.   Jessica Morgan 06/02/2013, 8:09 AM

## 2013-06-02 NOTE — Progress Notes (Signed)
Notified MD of moderate variability, occasional late, and MVU's. Order given to restart pitocin.

## 2013-06-02 NOTE — Anesthesia Preprocedure Evaluation (Signed)
Anesthesia Evaluation  Patient identified by MRN, date of birth, ID band Patient awake    Reviewed: Allergy & Precautions, H&P , Patient's Chart, lab work & pertinent test results  Airway Mallampati: III TM Distance: >3 FB Neck ROM: full    Dental   Pulmonary Current Smoker,  breath sounds clear to auscultation        Cardiovascular Rhythm:regular Rate:Normal     Neuro/Psych  Headaches, Seizures -,     GI/Hepatic   Endo/Other    Renal/GU      Musculoskeletal   Abdominal   Peds  Hematology  (+) anemia ,   Anesthesia Other Findings   Reproductive/Obstetrics (+) Pregnancy                           Anesthesia Physical Anesthesia Plan  ASA: II  Anesthesia Plan: Epidural   Post-op Pain Management:    Induction:   Airway Management Planned:   Additional Equipment:   Intra-op Plan:   Post-operative Plan:   Informed Consent: I have reviewed the patients History and Physical, chart, labs and discussed the procedure including the risks, benefits and alternatives for the proposed anesthesia with the patient or authorized representative who has indicated his/her understanding and acceptance.     Plan Discussed with:   Anesthesia Plan Comments:         Anesthesia Quick Evaluation

## 2013-06-02 NOTE — Lactation Note (Signed)
This note was copied from the chart of Jessica Morgan. Lactation Consultation Note  Patient Name: Jessica Rosalee KaufmanSarem Marotto [id number]'XToday's Date: 06/02/2013 Reason for consult: Other (Comment) (charting for exclusion)   Maternal Data Formula Feeding for Exclusion: Yes Reason for exclusion: Mother's choice to formula feed on admision  Feeding    LATCH Score/Interventions                      Lactation Tools Discussed/Used     Consult Status Consult Status: Complete    Zara ChessJoanne P Demica Zook 06/02/2013, 9:01 PM

## 2013-06-03 NOTE — Anesthesia Postprocedure Evaluation (Signed)
  Anesthesia Post-op Note  Patient: Rosalee KaufmanSarem Ospina  Procedure(s) Performed: * No procedures listed *  Patient Location: Mother/Baby  Anesthesia Type:Epidural  Level of Consciousness: awake, alert , oriented and patient cooperative  Airway and Oxygen Therapy: Patient Spontanous Breathing  Post-op Pain: mild  Post-op Assessment: Patient's Cardiovascular Status Stable, Respiratory Function Stable, No headache, No backache, No residual numbness and No residual motor weakness  Post-op Vital Signs: stable  Last Vitals:  Filed Vitals:   06/03/13 0350  BP: 90/48  Pulse: 101  Temp: 36.7 C  Resp: 18    Complications: No apparent anesthesia complications

## 2013-06-03 NOTE — Progress Notes (Signed)
PPD #1 No problems Afeb, VSS Fundus firm, NT at U-1 Continue routine postpartum care 

## 2013-06-04 MED ORDER — IBUPROFEN 600 MG PO TABS: 600.0000 mg | ORAL_TABLET | Freq: Four times a day (QID) | ORAL | Status: DC

## 2013-06-04 NOTE — Lactation Note (Signed)
This note was copied from the chart of Boy Koren Diodato. Lactation Consultation Note  Patient Name: Boy Rosalee KaufmanSarem Hornbaker ZOXWR'UToday's Date: 06/04/2013 Reason for consult: Follow-up assessment Follow-up assessment baby 37 hours of life. Mom is not sure if she is going to continue to BF, but she is primarily offering bottles. Reviewed engorgement prevention/treatment, both whether she intends to keep producing milk or not. Enc mom to put baby to breast if she wants to nurse. Discussed supply/demand if she is wanting to continue producing BM. Mom not interested in a DEBP. Mom aware of OP/BFSG services, community services, and LC number. Mom enc to call if needs assistance.  Maternal Data    Feeding Feeding Type:  (Mom is not sure if she is going to continue to attempt to BF. But she is offering primarily bottles. )  LATCH Score/Interventions                      Lactation Tools Discussed/Used     Consult Status Consult Status: Complete    Sherlyn HayJennifer D Emojean Gertz 06/04/2013, 10:09 AM

## 2013-06-04 NOTE — Discharge Instructions (Signed)
As per discharge pamphlet °

## 2013-06-04 NOTE — Progress Notes (Signed)
PPD #2 Doing well, trying to breastfeed Afeb, VSS D/c home

## 2013-06-04 NOTE — Lactation Note (Signed)
This note was copied from the chart of Jessica Morgan. Lactation Consultation Note Mom had been bottle feeding baby for over 24 hrs. Decided she might want to BF. Called into rm. Nipples short shaft, erect but compresses inwards. Hand pump used w/+ colostrum.  #20 NS demonstrated and applied. Baby fussy, formula squired into NS w/curve tip syring. Latched on well. Encouraged mom to support breast w/"C" hold. Baby in football position. Mom stated couldn't she just pump on bottle feed. Stated yes. DEBP taken into rm and set up. Pumped for 15 min. 1ml of colostrum given to baby in curve tip syring. Mom encouraged to feed baby 8-12 times/24 hours and with feeding cues. Specifics of an asymmetric latch shown. Reviewed Baby & Me book's Breastfeeding Basics. Mom encouraged to feed baby w/feeding cues. Mom shown how to use DEBP & how to disassemble, clean, & reassemble parts.Encouraged to call for assistance if needed and to verify proper latch.Hand expression taught to Mom. Mom made aware of O/P services, breastfeeding support groups, community resources, and our phone # for post-discharge questions. Mother informed of post-discharge support and given phone number to the lactation department, including services for phone call assistance; out-patient appointments; and breastfeeding support group. List of other breastfeeding resources in the community given in the handout. Encouraged mother to call for problems or concerns related to breastfeeding. Patient Name: Jessica Rosalee KaufmanSarem Bacigalupi ZOXWR'UToday's Date: 06/04/2013 Reason for consult: Follow-up assessment;Difficult latch   Maternal Data Does the patient have breastfeeding experience prior to this delivery?: No  Feeding Feeding Type: Breast Fed  LATCH Score/Interventions Latch: Repeated attempts needed to sustain latch, nipple held in mouth throughout feeding, stimulation needed to elicit sucking reflex. Intervention(s): Adjust position;Assist with latch;Breast  massage;Breast compression  Audible Swallowing: A few with stimulation Intervention(s): Hand expression;Skin to skin Intervention(s): Hand expression;Skin to skin  Type of Nipple: Everted at rest and after stimulation (compresses inwards, )  Comfort (Breast/Nipple): Soft / non-tender     Hold (Positioning): Assistance needed to correctly position infant at breast and maintain latch. Intervention(s): Breastfeeding basics reviewed;Support Pillows;Position options;Skin to skin  LATCH Score: 7  Lactation Tools Discussed/Used Tools: Shells;Nipple Dorris CarnesShields;Pump Nipple shield size: 20;24 Shell Type: Inverted Breast pump type: Double-Electric Breast Pump WIC Program: Yes   Consult Status Consult Status: Follow-up Date: 06/04/13 Follow-up type: In-patient    Charyl DancerLaura G Petrona Wyeth 06/04/2013, 1:44 AM

## 2013-06-04 NOTE — Discharge Summary (Signed)
Obstetric Discharge Summary Reason for Admission: induction of labor Prenatal Procedures: none Intrapartum Procedures: spontaneous vaginal delivery Postpartum Procedures: none Complications-Operative and Postpartum: 1st degree perineal laceration Hemoglobin  Date Value Ref Range Status  06/02/2013 8.1* 12.0 - 15.0 g/dL Final     HCT  Date Value Ref Range Status  06/02/2013 26.4* 36.0 - 46.0 % Final    Physical Exam:  General: alert Lochia: appropriate Uterine Fundus: firm  Discharge Diagnoses: Term Pregnancy-delivered  Discharge Information: Date: 06/04/2013 Activity: pelvic rest Diet: routine Medications: Ibuprofen Condition: stable Instructions: refer to practice specific booklet Discharge to: home Follow-up Information   Follow up with Lenay Lovejoy D, MD. Schedule an appointment as soon as possible for a visit in 6 weeks.   Specialty:  Obstetrics and Gynecology   Contact information:   [address], [address] [place] [address] [phone]       Newborn Data: Live born female  Birth Weight: 6 lb 15.3 oz (3155 g) APGAR: 7, 9  Home with mother.  Taylee Gunnells 06/04/2013, 8:29 AM

## 2013-06-04 NOTE — Progress Notes (Signed)
UR chart review completed.  

## 2013-12-06 ENCOUNTER — Encounter (HOSPITAL_COMMUNITY): Payer: Self-pay

## 2015-01-31 ENCOUNTER — Emergency Department (HOSPITAL_COMMUNITY)
Admission: EM | Admit: 2015-01-31 | Discharge: 2015-01-31 | Disposition: A | Discharge status: Home / Self Care | Payer: Medicaid Other | Attending: Emergency Medicine | Admitting: Emergency Medicine

## 2015-01-31 ENCOUNTER — Encounter (HOSPITAL_COMMUNITY): Payer: Self-pay | Admitting: Vascular Surgery

## 2015-01-31 DIAGNOSIS — Z8619 Personal history of other infectious and parasitic diseases: Secondary | ICD-10-CM | POA: Insufficient documentation

## 2015-01-31 DIAGNOSIS — Z3202 Encounter for pregnancy test, result negative: Secondary | ICD-10-CM | POA: Insufficient documentation

## 2015-01-31 DIAGNOSIS — Z87891 Personal history of nicotine dependence: Secondary | ICD-10-CM | POA: Insufficient documentation

## 2015-01-31 DIAGNOSIS — R55 Syncope and collapse: Secondary | ICD-10-CM

## 2015-01-31 LAB — CBC
HCT: 32 % — ABNORMAL LOW (ref 36.0–46.0)
Hemoglobin: 10.1 g/dL — ABNORMAL LOW (ref 12.0–15.0)
MCH: 24.4 pg — ABNORMAL LOW (ref 26.0–34.0)
MCHC: 31.6 g/dL (ref 30.0–36.0)
MCV: 77.3 fL — ABNORMAL LOW (ref 78.0–100.0)
Platelets: 145 10*3/uL — ABNORMAL LOW (ref 150–400)
RBC: 4.14 MIL/uL (ref 3.87–5.11)
RDW: 15.2 % (ref 11.5–15.5)
WBC: 6.9 10*3/uL (ref 4.0–10.5)

## 2015-01-31 LAB — RAPID URINE DRUG SCREEN, HOSP PERFORMED
Amphetamines: NOT DETECTED
Barbiturates: NOT DETECTED
Benzodiazepines: NOT DETECTED
Cocaine: NOT DETECTED
Opiates: NOT DETECTED
TETRAHYDROCANNABINOL: NOT DETECTED

## 2015-01-31 LAB — BASIC METABOLIC PANEL
Anion gap: 8 (ref 5–15)
BUN: 16 mg/dL (ref 6–20)
CALCIUM: 9.1 mg/dL (ref 8.9–10.3)
CO2: 26 mmol/L (ref 22–32)
CREATININE: 0.71 mg/dL (ref 0.44–1.00)
Chloride: 104 mmol/L (ref 101–111)
GFR calc Af Amer: >60 mL/min (ref >60)
GFR calc non Af Amer: >60 mL/min (ref >60)
Glucose, Bld: 95 mg/dL (ref 65–99)
Potassium: 4 mmol/L (ref 3.5–5.1)
Sodium: 138 mmol/L (ref 135–145)

## 2015-01-31 LAB — URINALYSIS, ROUTINE W REFLEX MICROSCOPIC
Bilirubin Urine: NEGATIVE
GLUCOSE, UA: NEGATIVE mg/dL
Hgb urine dipstick: NEGATIVE
Ketones, ur: NEGATIVE mg/dL
LEUKOCYTES UA: NEGATIVE
NITRITE: POSITIVE — AB
PROTEIN: NEGATIVE mg/dL
Specific Gravity, Urine: 1.013 (ref 1.005–1.030)
pH: 6 (ref 5.0–8.0)

## 2015-01-31 LAB — I-STAT BETA HCG BLOOD, ED (MC, WL, AP ONLY): I-stat hCG, quantitative: <5 m[IU]/mL (ref <5)

## 2015-01-31 LAB — URINE MICROSCOPIC-ADD ON

## 2015-01-31 NOTE — Discharge Instructions (Signed)
We saw you in the ER for the unresponsive episode. All the results in the ER are normal. We are not sure what is causing your symptoms. The workup in the ER is not complete, and is limited to screening for life threatening and emergent conditions only, so please see a primary care doctor for further evaluation.   Syncope Syncope is a medical term for fainting or passing out. This means you lose consciousness and drop to the ground. People are generally unconscious for less than 5 minutes. You may have some muscle twitches for up to 15 seconds before waking up and returning to normal. Syncope occurs more often in older adults, but it can happen to anyone. While most causes of syncope are not dangerous, syncope can be a sign of a serious medical problem. It is important to seek medical care.  CAUSES  Syncope is caused by a sudden drop in blood flow to the brain. The specific cause is often not determined. Factors that can bring on syncope include:  Taking medicines that lower blood pressure.  Sudden changes in posture, such as standing up quickly.  Taking more medicine than prescribed.  Standing in one place for too long.  Seizure disorders.  Dehydration and excessive exposure to heat.  Low blood sugar (hypoglycemia).  Straining to have a bowel movement.  Heart disease, irregular heartbeat, or other circulatory problems.  Fear, emotional distress, seeing blood, or severe pain. SYMPTOMS  Right before fainting, you may:  Feel dizzy or light-headed.  Feel nauseous.  See all white or all black in your field of vision.  Have cold, clammy skin. DIAGNOSIS  Your health care provider will ask about your symptoms, perform a physical exam, and perform an electrocardiogram (ECG) to record the electrical activity of your heart. Your health care provider may also perform other heart or blood tests to determine the cause of your syncope which may include:  Transthoracic echocardiogram  (TTE). During echocardiography, sound waves are used to evaluate how blood flows through your heart.  Transesophageal echocardiogram (TEE).  Cardiac monitoring. This allows your health care provider to monitor your heart rate and rhythm in real time.  Holter monitor. This is a portable device that records your heartbeat and can help diagnose heart arrhythmias. It allows your health care provider to track your heart activity for several days, if needed.  Stress tests by exercise or by giving medicine that makes the heart beat faster. TREATMENT  In most cases, no treatment is needed. Depending on the cause of your syncope, your health care provider may recommend changing or stopping some of your medicines. HOME CARE INSTRUCTIONS  Have someone stay with you until you feel stable.  Do not drive, use machinery, or play sports until your health care provider says it is okay.  Keep all follow-up appointments as directed by your health care provider.  Lie down right away if you start feeling like you might faint. Breathe deeply and steadily. Wait until all the symptoms have passed.  Drink enough fluids to keep your urine clear or pale yellow.  If you are taking blood pressure or heart medicine, get up slowly and take several minutes to sit and then stand. This can reduce dizziness. SEEK IMMEDIATE MEDICAL CARE IF:   You have a severe headache.  You have unusual pain in the chest, abdomen, or back.  You are bleeding from your mouth or rectum, or you have black or tarry stool.  You have an irregular or very fast  heartbeat.  You have pain with breathing.  You have repeated fainting or seizure-like jerking during an episode.  You faint when sitting or lying down.  You have confusion.  You have trouble walking.  You have severe weakness.  You have vision problems. If you fainted, call your local emergency services (911 in [place] Do not drive yourself to the hospital.    This  information is not intended to replace advice given to you by your health care provider. Make sure you discuss any questions you have with your health care provider.   Document Released: 01/21/2005 Document Revised: 06/07/2014 Document Reviewed: 03/22/2011 Elsevier Interactive Patient Education [employer] Epilepsy Epilepsy is a disorder in which a person has repeated seizures over time. A seizure is a release of abnormal electrical activity in the brain. Seizures can cause a change in attention, behavior, or the ability to remain awake and alert (altered mental status). Seizures often involve uncontrollable shaking (convulsions).  Most people with epilepsy lead normal lives. However, people with epilepsy are at an increased risk of falls, accidents, and injuries. Therefore, it is important to begin treatment right away. CAUSES  Epilepsy has many possible causes. Anything that disturbs the normal pattern of brain cell activity can lead to seizures. This may include:   Head injury.  Birth trauma.  High fever as a child.  Stroke.  Bleeding into or around the brain.  Certain drugs.  Prolonged low oxygen, such as what occurs after CPR efforts.  Abnormal brain development.  Certain illnesses, such as meningitis, encephalitis (brain infection), malaria, and other infections.  An imbalance of nerve signaling chemicals (neurotransmitters).  SIGNS AND SYMPTOMS  The symptoms of a seizure can vary greatly from one person to another. Right before a seizure, you may have a warning (aura) that a seizure is about to occur. An aura may include the following symptoms:  Fear or anxiety.  Nausea.  Feeling like the room is spinning (vertigo).  Vision changes, such as seeing flashing lights or spots. Common symptoms during a seizure include:  Abnormal sensations, such as an abnormal smell or a bitter taste in the mouth.   Sudden, general body stiffness.   Convulsions that involve  rhythmic jerking of the face, arm, or leg on one or both sides.   Sudden change in consciousness.   Appearing to be awake but not responding.   Appearing to be asleep but cannot be awakened.   Grimacing, chewing, lip smacking, drooling, tongue biting, or loss of bowel or bladder control. After a seizure, you may feel sleepy for a while. DIAGNOSIS  Your health care provider will ask about your symptoms and take a medical history. Descriptions from any witnesses to your seizures will be very helpful in the diagnosis. A physical exam, including a detailed neurological exam, is necessary. Various tests may be done, such as:   An electroencephalogram (EEG). This is a painless test of your brain waves. In this test, a diagram is created of your brain waves. These diagrams can be interpreted by a specialist.  An MRI of the brain.   A CT scan of the brain.   A spinal tap (lumbar puncture, LP).  Blood tests to check for signs of infection or abnormal blood chemistry. TREATMENT  There is no cure for epilepsy, but it is generally treatable. Once epilepsy is diagnosed, it is important to begin treatment as soon as possible. For most people with epilepsy, seizures can be controlled with medicines. The following may  also be used:  A pacemaker for the brain (vagus nerve stimulator) can be used for people with seizures that are not well controlled by medicine.  Surgery on the brain. For some people, epilepsy eventually goes away. HOME CARE INSTRUCTIONS   Follow your health care provider's recommendations on driving and safety in normal activities.  Get enough rest. Lack of sleep can cause seizures.  Only take over-the-counter or prescription medicines as directed by your health care provider. Take any prescribed medicine exactly as directed.  Avoid any known triggers of your seizures.  Keep a seizure diary. Record what you recall about any seizure, especially any possible trigger.    Make sure the people you live and work with know that you are prone to seizures. They should receive instructions on how to help you. In general, a witness to a seizure should:   Cushion your head and body.   Turn you on your side.   Avoid unnecessarily restraining you.   Not place anything inside your mouth.   Call for emergency medical help if there is any question about what has occurred.   Follow up with your health care provider as directed. You may need regular blood tests to monitor the levels of your medicine.  SEEK MEDICAL CARE IF:   You develop signs of infection or other illness. This might increase the risk of a seizure.   You seem to be having more frequent seizures.   Your seizure pattern is changing.  SEEK IMMEDIATE MEDICAL CARE IF:   You have a seizure that does not stop after a few moments.   You have a seizure that causes any difficulty in breathing.   You have a seizure that results in a very severe headache.   You have a seizure that leaves you with the inability to speak or use a part of your body.    This information is not intended to replace advice given to you by your health care provider. Make sure you discuss any questions you have with your health care provider.   Document Released: 01/21/2005 Document Revised: 11/11/2012 Document Reviewed: 09/02/2012 Elsevier Interactive Patient Education [employer]

## 2015-01-31 NOTE — ED Notes (Signed)
Pt reports to the ED for eval of syncopal episode while at work this am. She has a hx of seizures but coworkers reports they did not see and seizure activity. LOC time approx 30 sec- 1 min. Pt did fall and hit her head but denies any neck or back pain. Some posterior head pain. Pt denies any N/V/D. She has had normal PO intake. 12 lead en route was normal. Her PCP d/ced her seizure medications 6 months ago and she has not had one in over a year. Pt A&OX4, resp e/u, and skin warm and dry.

## 2015-01-31 NOTE — ED Provider Notes (Signed)
CSN: [id number]     Arrival date & time 01/31/15  1150 History   First MD Initiated Contact with Patient 01/31/15 1611     Chief Complaint  Patient presents with  . Near Syncope     (Consider location/radiation/quality/duration/timing/severity/associated sxs/prior Treatment) HPI Comments: 29 y/o F comes in with cc of syncope. Pt has no cardiac dz. She reports hx of seizures - not on any meds for 3 years (d/c by physicians). Pt recalls going to work, and checking in around 650 am. She doesn't recall  Any details from the event thereafter, until she woke up on the floor with EMS at scene. Her friend reports that pt came up to her and just passed out, but there was no shaking. Pt denies incontinence, tongue biting. She denies any chest pain, palpitations, dib. Pt has no pain. When pt had seizures, she shakes. She reports having 2 episodes/year of seizures since she stopped her meds. Last episode was 4-5 months ago. Pt was on dilantin, and she took it bid - but she cant remember the dose. No premature CAD or deaths in the family.   ROS 10 Systems reviewed and are negative for acute change except as noted in the HPI.     Patient is a 29 y.o. female presenting with near-syncope. The history is provided by the patient.  Near Syncope Pertinent negatives include no chest pain, no abdominal pain and no shortness of breath.    Past Medical History  Diagnosis Date  . Seizures (HCC)   . Seizure (HCC)   . Hx of chlamydia infection   . Headache(784.0)    History reviewed. No pertinent past surgical history. No family history on file. Social History  Substance Use Topics  . Smoking status: Former Smoker -- 1.00 packs/day for 5 years    Types: Cigarettes    Quit date: 10/01/2014  . Smokeless tobacco: None  . Alcohol Use: Yes     Comment: occasional   OB History    Gravida Para Term Preterm AB TAB SAB Ectopic Multiple Living   Review of Systems  Constitutional:  Negative for activity change.  HENT: Negative for facial swelling.   Respiratory: Negative for cough, shortness of breath and wheezing.   Cardiovascular: Positive for near-syncope. Negative for chest pain.  Gastrointestinal: Negative for nausea, vomiting, abdominal pain, diarrhea, constipation, blood in stool and abdominal distention.  Genitourinary: Negative for hematuria and difficulty urinating.  Musculoskeletal: Negative for neck pain.  Skin: Negative for color change.  Neurological: Negative for dizziness, syncope, speech difficulty, weakness and light-headedness.  Hematological: Does not bruise/bleed easily.  Psychiatric/Behavioral: Negative for confusion.      Allergies  Review of patient's allergies indicates no known allergies.  Home Medications   Prior to Admission medications   Not on File   BP 121/53 mmHg  Pulse 82  Temp(Src) 97.9 F (36.6 C) (Oral)  Resp 18  SpO2 96%  LMP 01/01/2015 (Approximate) Physical Exam  Constitutional: She is oriented to person, place, and time. She appears well-developed and well-nourished.  HENT:  Head: Normocephalic and atraumatic.  Eyes: Conjunctivae and EOM are normal. Pupils are equal, round, and reactive to light.  Neck: Normal range of motion. Neck supple.  Cardiovascular: Normal rate, regular rhythm and normal heart sounds.   No murmur heard. Pulmonary/Chest: Effort normal and breath sounds normal. No respiratory distress.  Abdominal: Soft. Bowel sounds are normal. She exhibits no  distension. There is no tenderness. There is no rebound and no guarding.  Musculoskeletal: She exhibits no edema.  Neurological: She is alert and oriented to person, place, and time. No cranial nerve deficit. Coordination normal.  Skin: Skin is warm and dry.  Nursing note and vitals reviewed.   ED Course  Procedures (including critical care time) Labs Review Labs Reviewed  CBC - Abnormal; Notable for the following:    Hemoglobin 10.1 (*)     HCT 32.0 (*)    MCV 77.3 (*)    MCH 24.4 (*)    Platelets 145 (*)    All other components within normal limits  URINALYSIS, ROUTINE W REFLEX MICROSCOPIC (NOT AT [facility]) - Abnormal; Notable for the following:    Nitrite POSITIVE (*)    All other components within normal limits  URINE MICROSCOPIC-ADD ON - Abnormal; Notable for the following:    Squamous Epithelial / LPF 0-5 (*)    Bacteria, UA MANY (*)    All other components within normal limits  BASIC METABOLIC PANEL  URINE RAPID DRUG SCREEN, HOSP PERFORMED  I-STAT BETA HCG BLOOD, ED ([facility], [facility], [facility] ONLY)      EKG Interpretation   Date/Time:  Tuesday January 31 2015 11:53:41 EST Ventricular Rate:  74 PR Interval:  146 QRS Duration: 84 QT Interval:  390 QTC Calculation: 432 R Axis:   60 Text Interpretation:  Normal sinus rhythm Normal ECG No significant change  since last tracing Confirmed by Rhunette CroftNANAVATI, MD, Janey GentaANKIT ([id number]) on 02/01/2015  1:54:47 AM      @5 :00 pm: Spoke with Dr. Marjory LiesPenumalli - who took care of patient few years back. He is happy to see the patient. In his judgement, if the patient's episodes are similar to previous episodes - he would prefer not starting any meds now.  MDM   Final diagnoses:  Syncope and collapse    Pt comes in with LOC as cc. Ddx: Seizure vs. Syncope.  She reports waking up and being back to baseline fairly quickly, denies incontinence, and the report provided to her was that there was no seizure like activity - which is all inconsistent with seizures.  Amnesia is not consistent with syncope - and quite frankly she has fairly significant anterograde amnesia - and i am not sure what to make of it. Will monitor on tele. Will discuss with Neuro to see if we can start her back on seizure meds.   Derwood KaplanAnkit Zerrick Hanssen, MD 02/01/15 [phone]

## 2018-04-18 ENCOUNTER — Emergency Department (HOSPITAL_COMMUNITY): Payer: Medicaid Other

## 2018-04-18 ENCOUNTER — Other Ambulatory Visit: Payer: Self-pay

## 2018-04-18 ENCOUNTER — Emergency Department (HOSPITAL_COMMUNITY)
Admission: EM | Admit: 2018-04-18 | Discharge: 2018-04-18 | Disposition: A | Discharge status: Home / Self Care | Payer: Medicaid Other | Attending: Emergency Medicine | Admitting: Emergency Medicine

## 2018-04-18 ENCOUNTER — Encounter (HOSPITAL_COMMUNITY): Payer: Self-pay | Admitting: Emergency Medicine

## 2018-04-18 DIAGNOSIS — J111 Influenza due to unidentified influenza virus with other respiratory manifestations: Secondary | ICD-10-CM | POA: Insufficient documentation

## 2018-04-18 DIAGNOSIS — Z87891 Personal history of nicotine dependence: Secondary | ICD-10-CM | POA: Insufficient documentation

## 2018-04-18 DIAGNOSIS — R69 Illness, unspecified: Secondary | ICD-10-CM

## 2018-04-18 LAB — INFLUENZA PANEL BY PCR (TYPE A & B)
Influenza A By PCR: NEGATIVE
Influenza B By PCR: NEGATIVE

## 2018-04-18 MED ORDER — ACETAMINOPHEN 325 MG PO TABS
650.0000 mg | ORAL_TABLET | Freq: Once | ORAL | Status: AC
  Administered 2018-04-18: 650 mg via ORAL
  Filled 2018-04-18: qty 2

## 2018-04-18 MED ORDER — IBUPROFEN 800 MG PO TABS: 800.0000 mg | ORAL_TABLET | Freq: Three times a day (TID) | ORAL | 0 refills | Status: DC

## 2018-04-18 MED ORDER — FLUTICASONE PROPIONATE 50 MCG/ACT NA SUSP: 1.0000 | Freq: Every day | NASAL | 0 refills | Status: DC

## 2018-04-18 MED ORDER — BENZONATATE 100 MG PO CAPS: 100.0000 mg | ORAL_CAPSULE | Freq: Three times a day (TID) | ORAL | 0 refills | Status: DC

## 2018-04-18 NOTE — ED Provider Notes (Signed)
[facility]-EMERGENCY DEPT Provider Note   CSN: [id number] Arrival date & time: 04/18/18  1505    History   Chief Complaint Chief Complaint  Patient presents with  . Cough  . Chills    HPI Jessica Morgan is a 33 y.o. female with a hx of seizures and anemia who presents to the ED with complaints of flu like sxs that started this AM. Reports subjective fever, chills, generalized body aches, congestion, & dry cough. Sxs constant, no alleviating/aggravating factors, no intervention PTA. Denies emesis, diarrhea, abdominal pain, dyspnea, chest pain, or syncope. No recent travel or known covid 19 exposures.      HPI  Past Medical History:  Diagnosis Date  . Headache(784.0)   . Hx of chlamydia infection   . Seizure (HCC)   . Seizures [facility])     Patient Active Problem List   Diagnosis Date Noted  . Normal pregnancy, first 06/02/2013  . SVD (spontaneous vaginal delivery) 06/02/2013  . ANEMIA 06/01/2009  . SEIZURE DISORDER 06/01/2009    History reviewed. No pertinent surgical history.   OB History    Gravida  1   Para  1   Term  1   Preterm      AB      Living  1     SAB      TAB      Ectopic      Multiple      Live Births  1            Home Medications    Prior to Admission medications   Not on File    Family History Family History  Problem Relation Age of Onset  . Hypertension Father   . Diabetes Father   . COPD Father     Social History Social History   Tobacco Use  . Smoking status: Former Smoker    Packs/day: 1.00    Years: 5.00    Pack years: 5.00    Types: Cigarettes    Last attempt to quit: 10/01/2014    Years since quitting: 3.5  Substance Use Topics  . Alcohol use: Yes    Comment: occasional  . Drug use: Never     Allergies   Patient has no known allergies.   Review of Systems Review of Systems  Constitutional: Positive for chills and fever.  HENT: Positive for congestion. Negative for ear  pain and sore throat.   Respiratory: Positive for cough. Negative for shortness of breath and wheezing.   Cardiovascular: Negative for chest pain.  Gastrointestinal: Negative for abdominal pain, diarrhea and vomiting.  Musculoskeletal: Positive for myalgias (generalized).     Physical Exam Updated Vital Signs BP 138/64 (BP Location: Left Arm)   Pulse (!) 103   Temp (!) 100.4 F (38 C) (Oral)   Resp 18   Wt 60.8 kg   LMP 03/27/2018 (Exact Date)   SpO2 97%   Breastfeeding No   BMI 25.32 kg/m   Physical Exam Vitals signs and nursing note reviewed.  Constitutional:      General: She is not in acute distress.    Appearance: She is well-developed.  HENT:     Head: Normocephalic and atraumatic.     Right Ear: Ear canal normal. Tympanic membrane is not perforated, erythematous, retracted or bulging.     Left Ear: Ear canal normal. Tympanic membrane is not perforated, erythematous, retracted or bulging.     Ears:     Comments: No mastoid  erythema/swelling/tenderness.     Nose:     Right Sinus: No maxillary sinus tenderness or frontal sinus tenderness.     Left Sinus: No maxillary sinus tenderness or frontal sinus tenderness.     Mouth/Throat:     Pharynx: Uvula midline. No oropharyngeal exudate or posterior oropharyngeal erythema.     Comments: Posterior oropharynx is symmetric appearing. Patient tolerating own secretions without difficulty. No trismus. No drooling. No hot potato voice. No swelling beneath the tongue, submandibular compartment is soft.  Eyes:     General:        Right eye: No discharge.        Left eye: No discharge.     Conjunctiva/sclera: Conjunctivae normal.     Pupils: Pupils are equal, round, and reactive to light.  Neck:     Musculoskeletal: Normal range of motion and neck supple. No edema or neck rigidity.  Cardiovascular:     Rate and Rhythm: Regular rhythm. Tachycardia present.     Heart sounds: No murmur.  Pulmonary:     Effort: Pulmonary effort is  normal. No respiratory distress.     Breath sounds: Normal breath sounds. No wheezing, rhonchi or rales.  Abdominal:     General: There is no distension.     Palpations: Abdomen is soft.     Tenderness: There is no abdominal tenderness.  Lymphadenopathy:     Cervical: No cervical adenopathy.  Skin:    General: Skin is warm and dry.     Findings: No rash.  Neurological:     Mental Status: She is alert.  Psychiatric:        Behavior: Behavior normal.    ED Treatments / Results  Labs (all labs ordered are listed, but only abnormal results are displayed) Labs Reviewed - No data to display  EKG None  Radiology No results found.  Procedures Procedures (including critical care time)  Medications Ordered in ED Medications - No data to display   Initial Impression / Assessment and Plan / ED Course  I have reviewed the triage vital signs and the nursing notes.  Pertinent labs & imaging results that were available during my care of the patient were reviewed by me and considered in my medical decision making (see chart for details).  Patient presents with flu like symptoms.  Patient is nontoxic appearing, in no apparent distress, vitals w/ mild tachycardia likely resultant from fever w/ temp of 100.4. Lungs CTA CXR negative for infiltrate, doubt pneumonia. There is no wheezing or signs of respiratory distress. Sxs onset < 7 days, no sinus tenderness, doubt acute bacterial sinusitis. Centor score 1 for temp, doubt strep pharyngitis. No evidence of AOM on exam. No meningeal signs. No recent travel or exposures to covid 19 to prompt testing. Influenza negative, remains flu like in nature, will treat supportively with prescriptions including Ibuprofen, Flonase, and Tessalon. I discussed results, treatment plan, need for PCP follow-up, and return precautions with the patient. Provided opportunity for questions, patient confirmed understanding and is in agreement with plan.   Final Clinical  Impressions(s) / ED Diagnoses   Final diagnoses:  Influenza-like illness    ED Discharge Orders         Ordered    fluticasone (FLONASE) 50 MCG/ACT nasal spray  Daily     04/18/18 1707    benzonatate (TESSALON) 100 MG capsule  Every 8 hours     04/18/18 1707    ibuprofen (ADVIL,MOTRIN) 800 MG tablet  3 times daily  04/18/18 1707           Sarissa Dern, Pleas Koch, PA-C 04/18/18 1708    Virgina Norfolk, DO 04/18/18 1756

## 2018-04-18 NOTE — Discharge Instructions (Addendum)
You were seen in the emergency today for upper respiratory symptoms, we suspect your symptoms are related to a virus at this time.  Your influenza testing was negative. Your chest  xray was normal. I have prescribed you multiple medications to treat your symptoms.   -Flonase to be used 1 spray in each nostril daily.  This medication is used to treat your congestion.  -Tessalon can be taken once every 8 hours as needed.  This medication is used to treat your cough.  -Ibuprofen to be taken once every 8 hours as needed for pain. Please take this medicine with food as it can cause stomach upset and at worst stomach bleeding. Do not take other NSAIDs such as motrin, aleve, advil, naproxen, mobic, etc as they are similar. You make take tylenol per over the counter dosing with this medicine safely.  We have prescribed you new medication(s) today. Discuss the medications prescribed today with your pharmacist as they can have adverse effects and interactions with your other medicines including over the counter and prescribed medications. Seek medical evaluation if you start to experience new or abnormal symptoms after taking one of these medicines, seek care immediately if you start to experience difficulty breathing, feeling of your throat closing, facial swelling, or rash as these could be indications of a more serious allergic reaction  You will need to follow-up with your primary care provider in 3-5 days if your symptoms have not improved.  If you do not have a primary care provider one is provided in your discharge instructions.  Return to the emergency department for any new or worsening symptoms including but not limited to persistent fever for 5 days, difficulty breathing, chest pain, rashes, passing out, or any other concerns.

## 2018-04-18 NOTE — ED Triage Notes (Signed)
Pt reports generalized body aches and occasional moist cough since this am. Pt has not taken anything for the discomfort. No obvious exposure or travel reported

## 2018-06-26 ENCOUNTER — Emergency Department (HOSPITAL_COMMUNITY): Payer: Self-pay

## 2018-06-26 ENCOUNTER — Emergency Department (HOSPITAL_COMMUNITY)
Admission: EM | Admit: 2018-06-26 | Discharge: 2018-06-26 | Disposition: A | Discharge status: Home / Self Care | Payer: Self-pay | Attending: Emergency Medicine | Admitting: Emergency Medicine

## 2018-06-26 ENCOUNTER — Other Ambulatory Visit: Payer: Self-pay

## 2018-06-26 ENCOUNTER — Encounter (HOSPITAL_COMMUNITY): Payer: Self-pay | Admitting: Emergency Medicine

## 2018-06-26 DIAGNOSIS — Z79899 Other long term (current) drug therapy: Secondary | ICD-10-CM | POA: Insufficient documentation

## 2018-06-26 DIAGNOSIS — R55 Syncope and collapse: Secondary | ICD-10-CM | POA: Insufficient documentation

## 2018-06-26 LAB — CBC WITH DIFFERENTIAL/PLATELET
Abs Immature Granulocytes: 0.02 K/uL (ref 0.00–0.07)
Basophils Absolute: 0 K/uL (ref 0.0–0.1)
Basophils Relative: 0 %
Eosinophils Absolute: 0 K/uL (ref 0.0–0.5)
Eosinophils Relative: 1 %
HCT: 31.8 % — ABNORMAL LOW (ref 36.0–46.0)
Hemoglobin: 9.7 g/dL — ABNORMAL LOW (ref 12.0–15.0)
Immature Granulocytes: 0 %
Lymphocytes Relative: 24 %
Lymphs Abs: 1.1 K/uL (ref 0.7–4.0)
MCH: 24.3 pg — ABNORMAL LOW (ref 26.0–34.0)
MCHC: 30.5 g/dL (ref 30.0–36.0)
MCV: 79.7 fL — ABNORMAL LOW (ref 80.0–100.0)
Monocytes Absolute: 0.3 K/uL (ref 0.1–1.0)
Monocytes Relative: 6 %
Neutro Abs: 3.2 K/uL (ref 1.7–7.7)
Neutrophils Relative %: 69 %
Platelets: 141 K/uL — ABNORMAL LOW (ref 150–400)
RBC: 3.99 MIL/uL (ref 3.87–5.11)
RDW: 14.3 % (ref 11.5–15.5)
WBC: 4.6 K/uL (ref 4.0–10.5)
nRBC: 0 % (ref 0.0–0.2)

## 2018-06-26 LAB — COMPREHENSIVE METABOLIC PANEL WITH GFR
ALT: 16 U/L (ref 0–44)
AST: 27 U/L (ref 15–41)
Albumin: 4.2 g/dL (ref 3.5–5.0)
Alkaline Phosphatase: 32 U/L — ABNORMAL LOW (ref 38–126)
Anion gap: 12 (ref 5–15)
BUN: 21 mg/dL — ABNORMAL HIGH (ref 6–20)
CO2: 22 mmol/L (ref 22–32)
Calcium: 8.9 mg/dL (ref 8.9–10.3)
Chloride: 105 mmol/L (ref 98–111)
Creatinine, Ser: 0.9 mg/dL (ref 0.44–1.00)
GFR calc Af Amer: >60 mL/min
GFR calc non Af Amer: >60 mL/min
Glucose, Bld: 86 mg/dL (ref 70–99)
Potassium: 4 mmol/L (ref 3.5–5.1)
Sodium: 139 mmol/L (ref 135–145)
Total Bilirubin: 1.1 mg/dL (ref 0.3–1.2)
Total Protein: 7.4 g/dL (ref 6.5–8.1)

## 2018-06-26 LAB — I-STAT BETA HCG BLOOD, ED (MC, WL, AP ONLY): I-stat hCG, quantitative: <5 m[IU]/mL

## 2018-06-26 LAB — CBG MONITORING, ED: Glucose-Capillary: 84 mg/dL (ref 70–99)

## 2018-06-26 MED ORDER — SODIUM CHLORIDE 0.9 % IV SOLN
INTRAVENOUS | Status: DC
  Administered 2018-06-26: 10:00:00 via INTRAVENOUS

## 2018-06-26 NOTE — ED Triage Notes (Signed)
Pt BIB [facility] from work. Per EMS pt had witnessed syncopal episode while at work this morning. No complaint of injury. Pt has history of seizures but no history of syncope.

## 2018-06-26 NOTE — Discharge Instructions (Addendum)
As discussed, today's evaluation has been generally reassuring. However, it is important that you continue to have today's episode evaluated, either with your physician or with our cardiologists. Please consider Holter monitoring means of obtaining more information regarding your heart function.  Return here for concerning changes in your condition.

## 2018-06-26 NOTE — ED Provider Notes (Signed)
[facility] EMERGENCY DEPARTMENT Provider Note   CSN: [id number] Arrival date & time: 06/26/18  3545    History   Chief Complaint Chief Complaint  Patient presents with  . Loss of Consciousness    HPI Jessica Morgan is a 33 y.o. female.     HPI Patient presents from work after an episode of syncope. Patient has a history of seizures, though she notes that he is taking no medication for seizures as it has not occurred in some time. She only takes vitamins regularly, notes that she was in her usual state of health until this morning.  When she recalls awakening feeling more tired than usual, but without focal pain. While at work, and a Tax adviser at [employer], the patient had episode of syncope. She does not recall chest pain either before or afterwards, nor sustained.  Of returning to normal. No reported trauma, and she denies any focal pain, acknowledges feeling slightly tired in general.    Past Medical History:  Diagnosis Date  . Headache(784.0)   . Hx of chlamydia infection   . Seizure (HCC)   . Seizures [facility])     Patient Active Problem List   Diagnosis Date Noted  . Normal pregnancy, first 06/02/2013  . SVD (spontaneous vaginal delivery) 06/02/2013  . ANEMIA 06/01/2009  . SEIZURE DISORDER 06/01/2009    History reviewed. No pertinent surgical history.   OB History    Gravida  1   Para  1   Term  1   Preterm      AB      Living  1     SAB      TAB      Ectopic      Multiple      Live Births  1            Home Medications    Prior to Admission medications   Medication Sig Start Date End Date Taking? Authorizing Provider  fluticasone (FLONASE) 50 MCG/ACT nasal spray Place 1 spray into both nostrils daily. Patient taking differently: Place 1 spray into both nostrils daily as needed for allergies.  04/18/18  Yes Petrucelli, Samantha R, PA-C  ibuprofen (ADVIL) 200 MG tablet Take 400 mg by mouth every 6  (six) hours as needed for moderate pain.   Yes [provider]  Multiple Vitamins-Minerals (MULTIVITAMIN WITH MINERALS) tablet Take 1 tablet by mouth daily.   Yes [provider]  benzonatate (TESSALON) 100 MG capsule Take 1 capsule (100 mg total) by mouth every 8 (eight) hours. Patient not taking: Reported on 06/26/2018 04/18/18   Petrucelli, Pleas Koch, PA-C  ibuprofen (ADVIL,MOTRIN) 800 MG tablet Take 1 tablet (800 mg total) by mouth 3 (three) times daily. Patient not taking: Reported on 06/26/2018 04/18/18   Petrucelli, Pleas Koch, PA-C    Family History Family History  Problem Relation Age of Onset  . Hypertension Father   . Diabetes Father   . COPD Father     Social History Social History   Tobacco Use  . Smoking status: Former Smoker    Packs/day: 1.00    Years: 5.00    Pack years: 5.00    Types: Cigarettes    Last attempt to quit: 10/01/2014    Years since quitting: 3.7  . Smokeless tobacco: Never Used  Substance Use Topics  . Alcohol use: Yes    Comment: occasional  . Drug use: Never     Allergies   Patient has  no known allergies.   Review of Systems Review of Systems  Constitutional:       Per HPI, otherwise negative  HENT:       Per HPI, otherwise negative  Respiratory:       Per HPI, otherwise negative  Cardiovascular:       Per HPI, otherwise negative  Gastrointestinal: Negative for vomiting.  Endocrine:       Negative aside from HPI  Genitourinary:       Neg aside from HPI   Musculoskeletal:       Per HPI, otherwise negative  Skin: Negative.   Neurological: Positive for seizures and syncope.     Physical Exam Updated Vital Signs BP 103/72   Pulse (!) 59   Temp 98.5 F (36.9 C) (Oral)   Resp 16   LMP 06/19/2018   SpO2 100%   Physical Exam Vitals signs and nursing note reviewed.  Constitutional:      General: She is not in acute distress.    Appearance: She is well-developed.  HENT:     Head: Normocephalic and  atraumatic.  Eyes:     Conjunctiva/sclera: Conjunctivae normal.  Cardiovascular:     Rate and Rhythm: Normal rate and regular rhythm.  Pulmonary:     Effort: Pulmonary effort is normal. No respiratory distress.     Breath sounds: Normal breath sounds. No stridor.  Abdominal:     General: There is no distension.  Skin:    General: Skin is warm and dry.  Neurological:     Mental Status: She is alert and oriented to person, place, and time.     Cranial Nerves: No cranial nerve deficit.      ED Treatments / Results  Labs (all labs ordered are listed, but only abnormal results are displayed) Labs Reviewed  COMPREHENSIVE METABOLIC PANEL - Abnormal; Notable for the following components:      Result Value   BUN 21 (*)    Alkaline Phosphatase 32 (*)    All other components within normal limits  CBC WITH DIFFERENTIAL/PLATELET - Abnormal; Notable for the following components:   Hemoglobin 9.7 (*)    HCT 31.8 (*)    MCV 79.7 (*)    MCH 24.3 (*)    Platelets 141 (*)    All other components within normal limits  CBG MONITORING, ED  I-STAT BETA HCG BLOOD, ED ([facility], [facility], [facility] ONLY)    EKG EKG Interpretation  Date/Time:  Friday Jun 26 2018 09:05:44 EDT Ventricular Rate:  62 PR Interval:    QRS Duration: 96 QT Interval:  422 QTC Calculation: 429 R Axis:   71 Text Interpretation:  Sinus rhythm unremarkable ECG Confirmed by Gerhard Munch [phone]) on 06/26/2018 9:07:46 AM   Radiology Dg Chest 2 View  Result Date: 06/26/2018 CLINICAL DATA:  Syncopal episode today. Bilateral shoulder pain. Previous smoker. EXAM: CHEST - 2 VIEW COMPARISON:  None available. FINDINGS: The heart size and mediastinal contours are normal. The lungs are clear. There is no pleural effusion or pneumothorax. No acute osseous findings are identified. Telemetry leads overlie the chest. IMPRESSION: No active cardiopulmonary process. Electronically Signed   By: Carey Bullocks M.D.   On: 06/26/2018 10:06     Procedures Procedures (including critical care time)  Medications Ordered in ED Medications  0.9 %  sodium chloride infusion ( Intravenous New Bag/Given 06/26/18 0958)     Initial Impression / Assessment and Plan / ED Course  I have reviewed the triage vital signs  and the nursing notes.  Pertinent labs & imaging results that were available during my care of the patient were reviewed by me and considered in my medical decision making (see chart for details).    Initial vitals notable for systolic less than 100. Fluids provided well labs, EKG, x-ray obtained for consideration of etiology for syncope.    12:08 PM In no distress, awake, alert. She states that she feels better. We reviewed labs, x-ray, EKG together. No evidence for sustained arrhythmia, no substantial electrolyte abnormalities, no rhythm strip abnormalities for 4 hours of monitoring here. Patient does have a history of seizures in the distant past, but given the absence of a prodrome, or any.  Consistent with postictal phase, this seems less likely. We discussed the importance of following up with her physician and/or our cardiologists for consideration of Holter monitoring.   Final Clinical Impressions(s) / ED Diagnoses   Final diagnoses:  Syncope and collapse      Gerhard MunchLockwood, Shaquel Chavous, MD 06/26/18 1528

## 2018-06-26 NOTE — ED Notes (Signed)
Patient transported to X-ray 

## 2019-08-07 IMAGING — CR CHEST - 2 VIEW
2 series · 2 of 2 positions shown · non-contrast
Comparison: None.

CLINICAL DATA: Body aches, fever, cough

EXAM:
CHEST - 2 VIEW

[w chest pa]
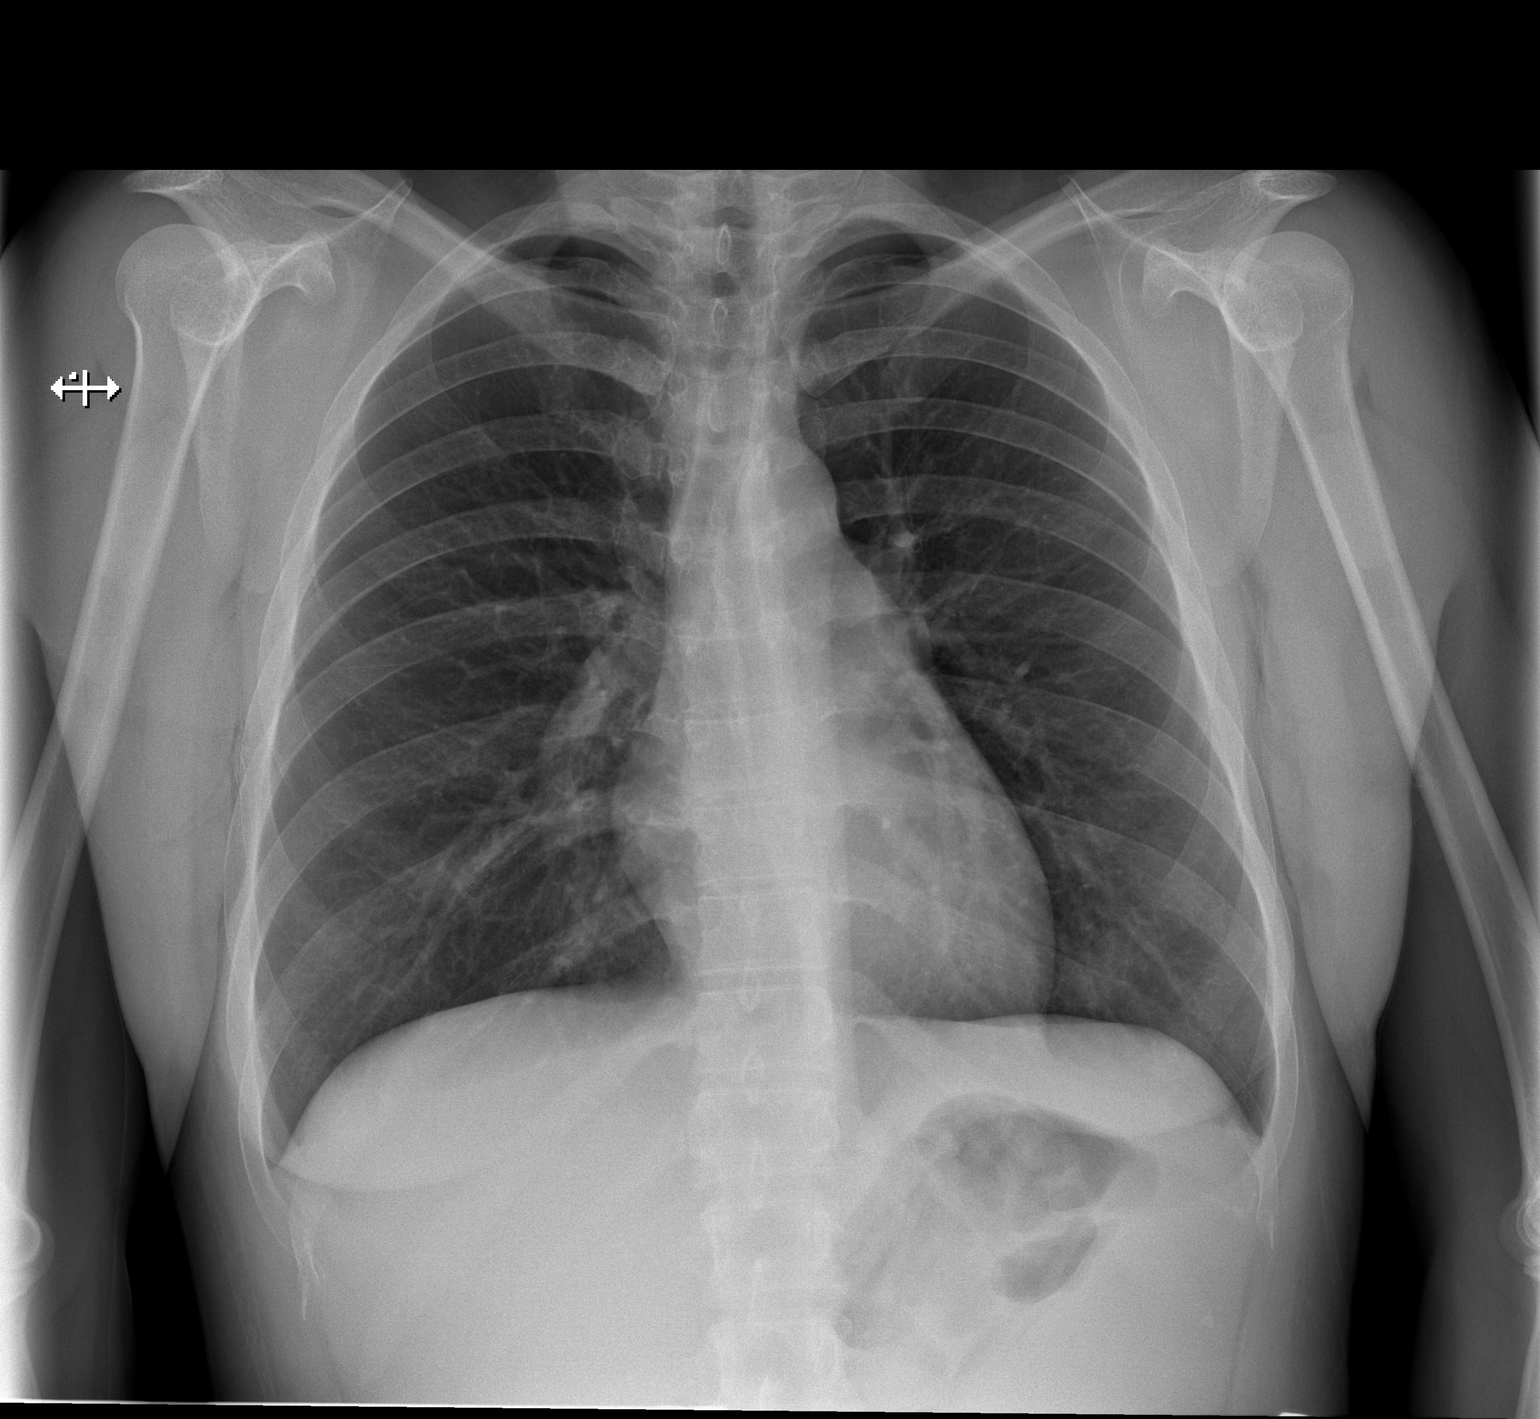

[w chest lat]
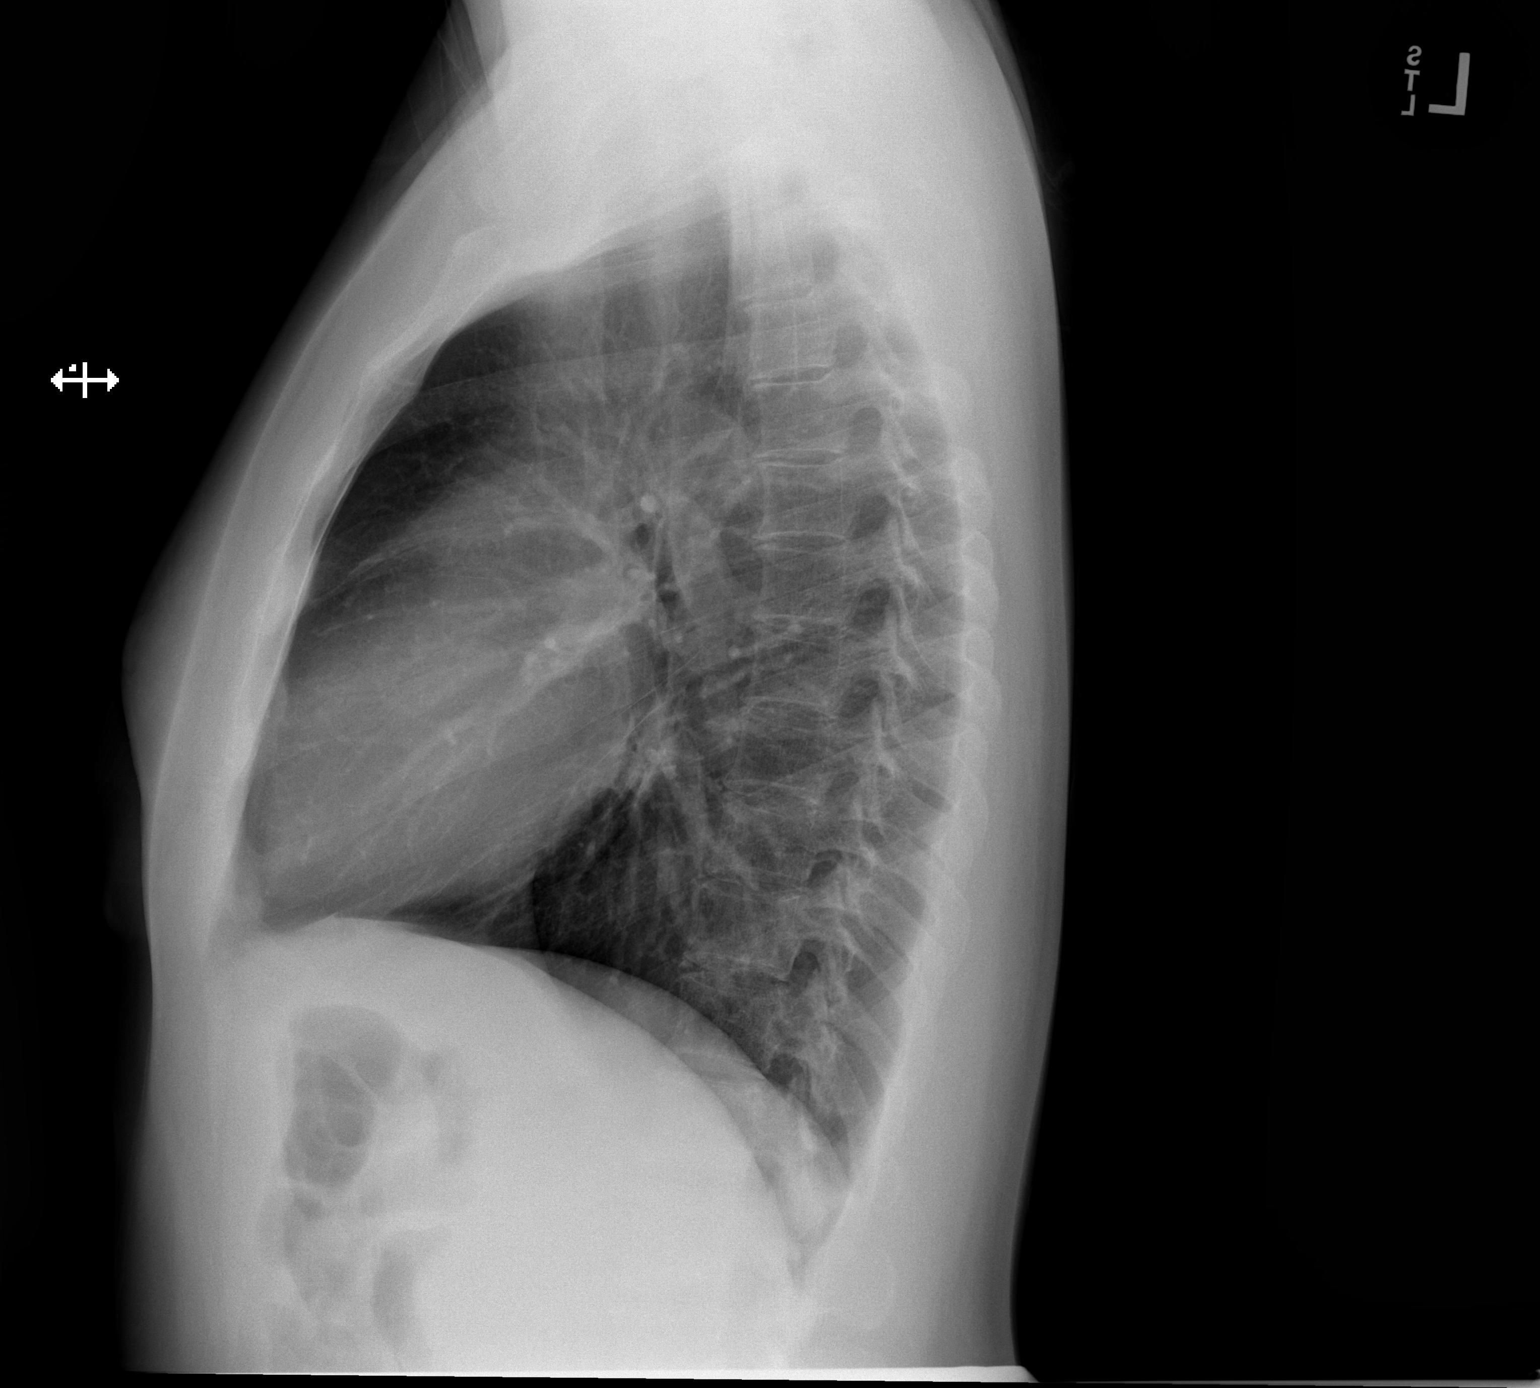

[2 of 2 positions shown; findings below may reference images not displayed]

FINDINGS: Heart and mediastinal contours are within normal limits. No focal
opacities or effusions. No acute bony abnormality.
IMPRESSION: No active cardiopulmonary disease.

## 2019-10-15 IMAGING — CR CHEST - 2 VIEW
2 series · 2 of 2 positions shown · non-contrast
Comparison: None available.

CLINICAL DATA: Syncopal episode today. Bilateral shoulder pain.
Previous smoker.

EXAM:
CHEST - 2 VIEW

[chest lat]
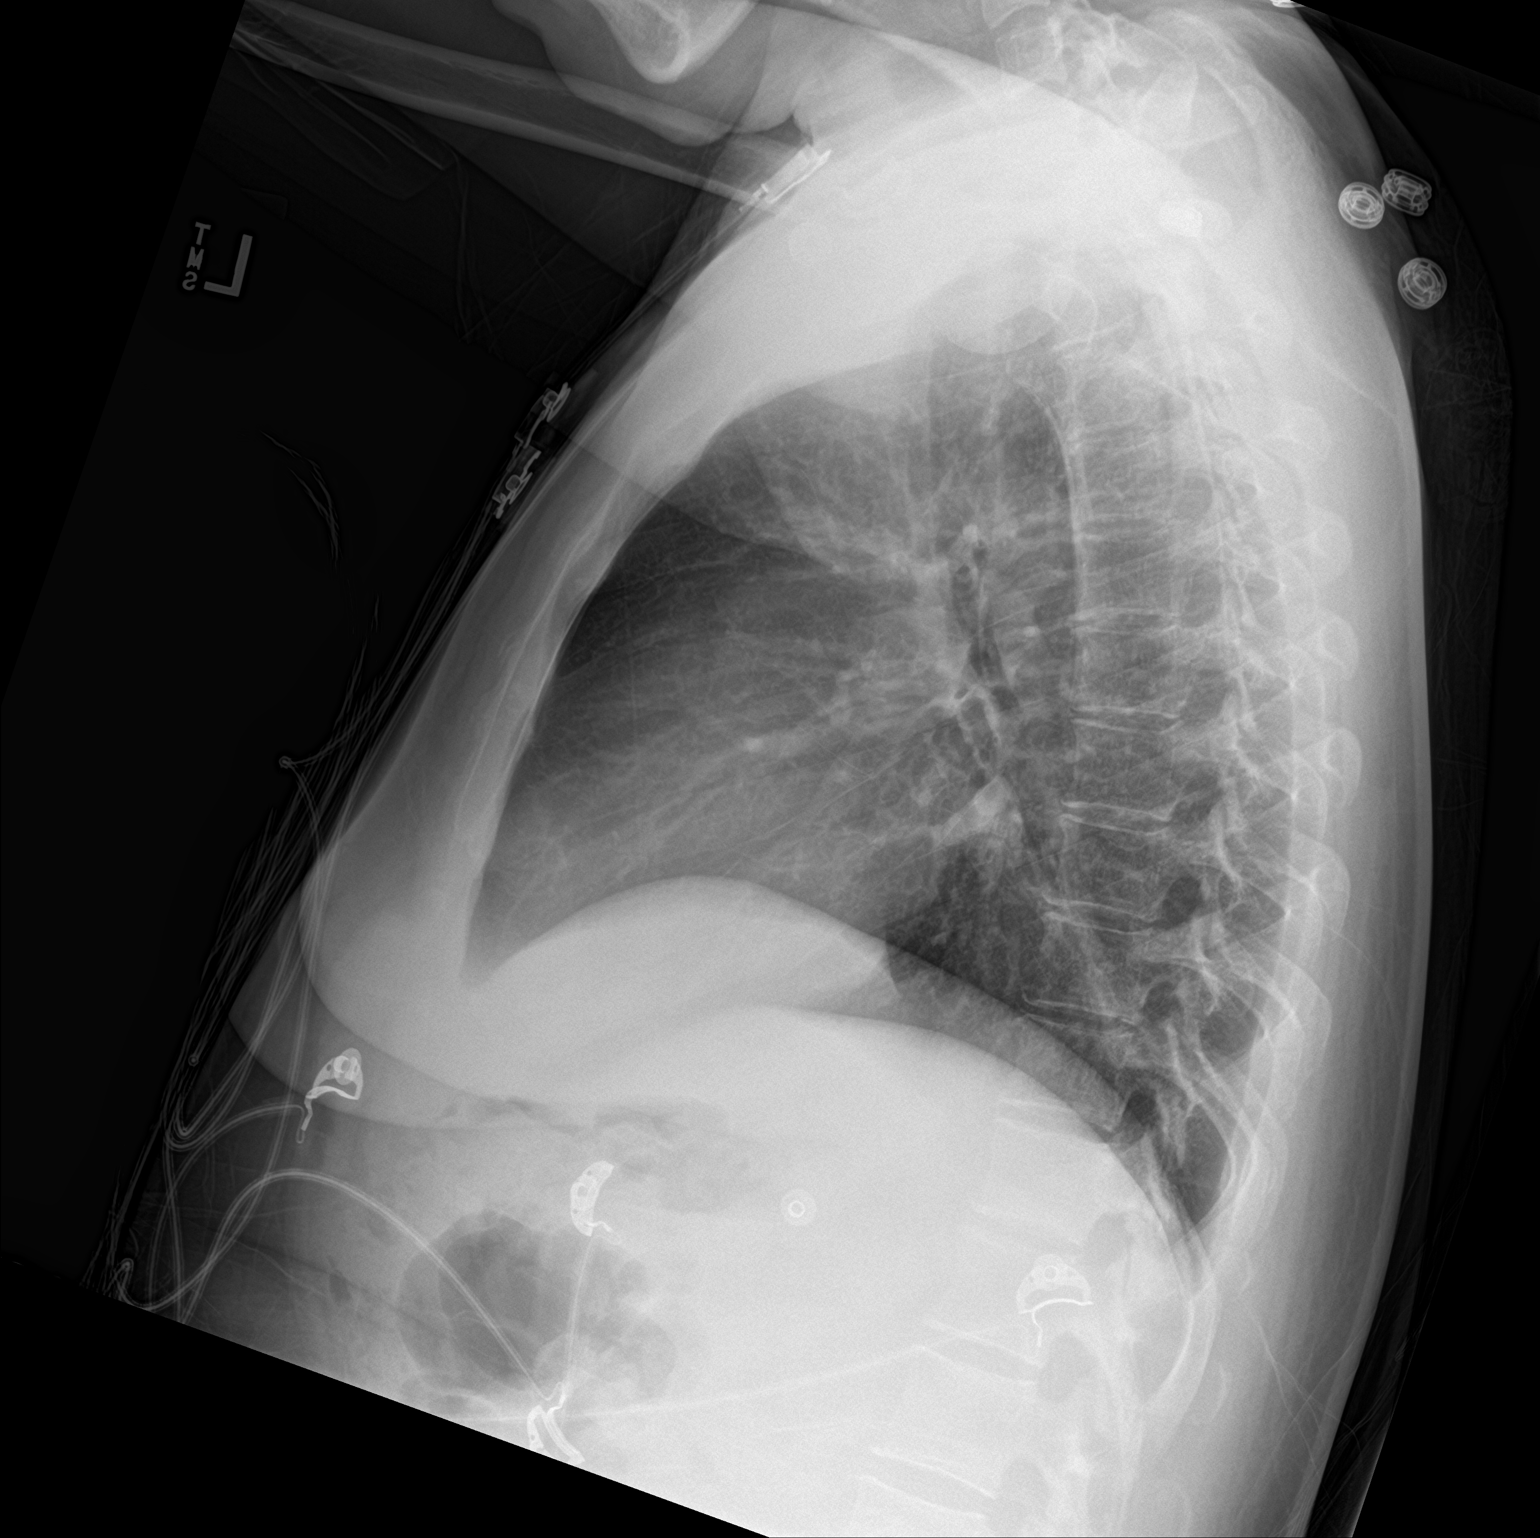

[chest ap]
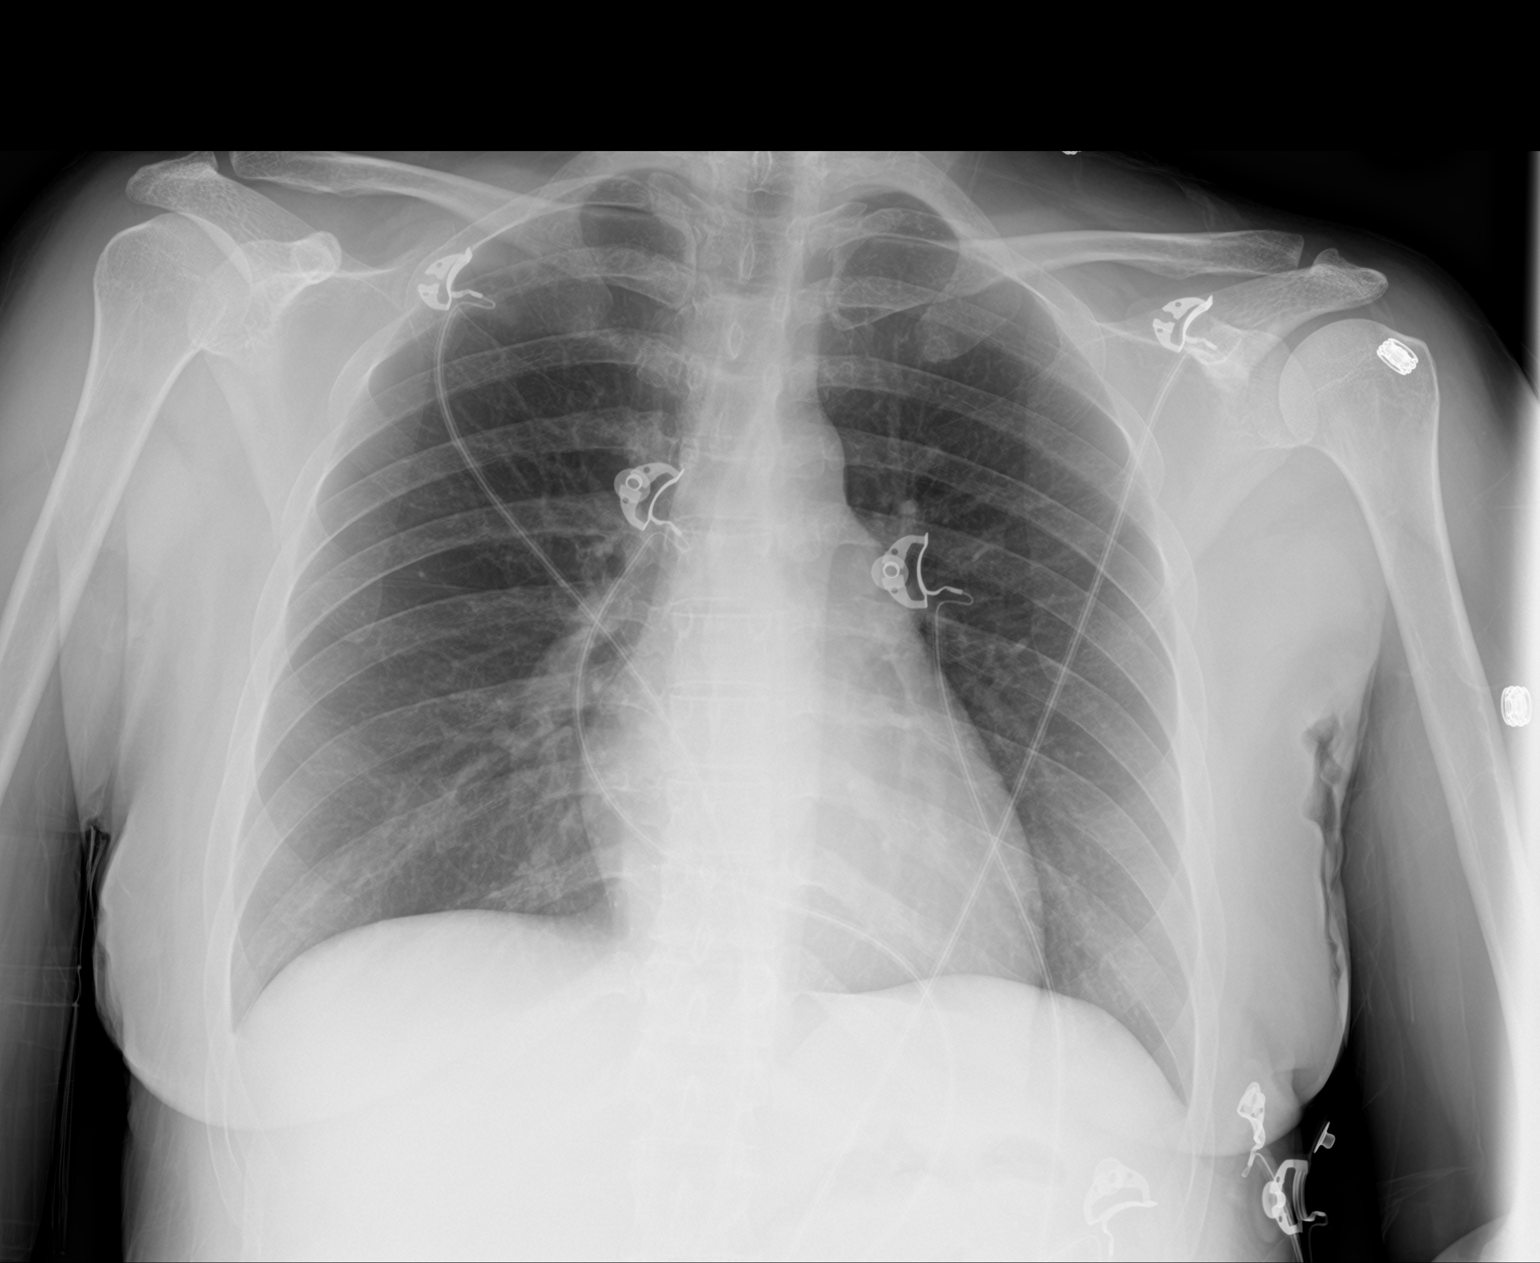

[2 of 2 positions shown; findings below may reference images not displayed]

FINDINGS: The heart size and mediastinal contours are normal. The lungs are
clear. There is no pleural effusion or pneumothorax. No acute
osseous findings are identified. Telemetry leads overlie the chest.
IMPRESSION: No active cardiopulmonary process.

## 2022-01-30 ENCOUNTER — Ambulatory Visit: Payer: Medicaid Other | Admitting: Nurse Practitioner

## 2022-01-30 ENCOUNTER — Encounter: Payer: Self-pay | Admitting: Nurse Practitioner

## 2022-01-30 ENCOUNTER — Other Ambulatory Visit (HOSPITAL_COMMUNITY)
Admission: RE | Admit: 2022-01-30 | Discharge: 2022-01-30 | Disposition: A | Discharge status: Home / Self Care | Payer: Medicaid Other | Source: Ambulatory Visit | Attending: Nurse Practitioner | Admitting: Nurse Practitioner

## 2022-01-30 VITALS — BP 110/70 | HR 80 | Temp 97.6°F | Ht 61.0 in | Wt 145.0 lb

## 2022-01-30 DIAGNOSIS — R569 Unspecified convulsions: Secondary | ICD-10-CM | POA: Diagnosis not present

## 2022-01-30 DIAGNOSIS — F32A Depression, unspecified: Secondary | ICD-10-CM | POA: Diagnosis not present

## 2022-01-30 DIAGNOSIS — F419 Anxiety disorder, unspecified: Secondary | ICD-10-CM | POA: Diagnosis not present

## 2022-01-30 DIAGNOSIS — N898 Other specified noninflammatory disorders of vagina: Secondary | ICD-10-CM

## 2022-01-30 DIAGNOSIS — Z1159 Encounter for screening for other viral diseases: Secondary | ICD-10-CM

## 2022-01-30 DIAGNOSIS — Z13220 Encounter for screening for lipoid disorders: Secondary | ICD-10-CM

## 2022-01-30 DIAGNOSIS — Z2981 Encounter for HIV pre-exposure prophylaxis: Secondary | ICD-10-CM | POA: Diagnosis not present

## 2022-01-30 DIAGNOSIS — D509 Iron deficiency anemia, unspecified: Secondary | ICD-10-CM | POA: Diagnosis not present

## 2022-01-30 DIAGNOSIS — Z114 Encounter for screening for human immunodeficiency virus [HIV]: Secondary | ICD-10-CM

## 2022-01-30 LAB — POCT URINALYSIS DIPSTICK
Bilirubin, UA: NEGATIVE
Blood, UA: NEGATIVE
Glucose, UA: NEGATIVE
Ketones, UA: NEGATIVE
Leukocytes, UA: NEGATIVE
Nitrite, UA: POSITIVE
Protein, UA: NEGATIVE
Spec Grav, UA: 1.01 (ref 1.010–1.025)
Urobilinogen, UA: 0.2 E.U./dL
pH, UA: 6 (ref 5.0–8.0)

## 2022-01-30 MED ORDER — EMTRICITABINE-TENOFOVIR DF 200-300 MG PO TABS: 1.0000 | ORAL_TABLET | Freq: Every day | ORAL | 0 refills | Status: DC

## 2022-01-30 NOTE — Assessment & Plan Note (Signed)
She has a history of iron deficiency anemia.  Will check CBC and iron panel today.

## 2022-01-30 NOTE — Patient Instructions (Signed)
It was great to see you!  I have placed a referral to neurology and therapist. They will call to schedule.   Start truvada 1 tablet daily for Prep   We are checking your labs today and will let you know the results via mychart/phone.   Let's follow-up in 3 months, sooner if you have concerns.  If a referral was placed today, you will be contacted for an appointment. Please note that routine referrals can sometimes take up to 3-4 weeks to process. Please call our office if you haven't heard anything after this time frame.  Take care,  Rodman Pickle, NP

## 2022-01-30 NOTE — Assessment & Plan Note (Signed)
Her partner is currently HIV positive, however taking antivirals and is undetectable.  She is interested in starting PrEP.  Will check CMP, HIV today and start Truvada 1 tablet daily.  Follow-up in 3 months.

## 2022-01-30 NOTE — Progress Notes (Signed)
New Patient Visit  BP 110/70 (BP Location: Right Arm, Patient Position: Sitting, Cuff Size: Small)   Pulse 80   Temp 97.6 F (36.4 C) (Temporal)   Ht 5\' 1"  (1.549 m)   Wt 145 lb (65.8 kg)   SpO2 98%   BMI 27.40 kg/m    Subjective:    Patient ID: , female    DOB: 15-Aug-1985, 36 y.o.   MRN: [id number]  CC: Chief Complaint  Patient presents with   Establish Care    New Pt, Est care Anemia , cholesterol Would like PREP due to partner being HIV Positive     HPI: Jessica Morgan is a 36 y.o. female presents for new patient visit to establish care.  Introduced to 20 role and practice setting.  All questions answered.  Discussed provider/patient relationship and expectations.  She has been having depression and anxiety that has worsened since her father's death March 06, 2020. She has missed work some days due to feeling tired and sleeping all day. She denies panic attacks. She does have trouble sleeping at times as well. She has not taken medication or done therapy in the past.  She has a history of seizures.  She was taking Dilantin in the past, however this did not help control her symptoms.  She does not have insurance and had not been seeing any healthcare providers recently.  She states that has been several months since her last seizure.  She has a history of anemia and would like her labs checked today.  She denies shortness of breath and chest pain.  Her partner is HIV positive and is currently on antivirals and is undetectable.  She is interested in starting PrEP to help prevent HIV transmission.     01/30/2022    3:38 PM  Depression screen PHQ 2/9  Decreased Interest 1  Down, Depressed, Hopeless 1  PHQ - 2 Score 2  Altered sleeping 3  Tired, decreased energy 2  Change in appetite 3  Feeling bad or failure about yourself  2  Trouble concentrating 0  Moving slowly or fidgety/restless 1  Suicidal thoughts 0  PHQ-9 Score 13  Difficult doing  work/chores Somewhat difficult      01/30/2022    3:38 PM  GAD 7 : Generalized Anxiety Score  Nervous, Anxious, on Edge 1  Control/stop worrying 1  Worry too much - different things 1  Trouble relaxing 0  Restless 0  Easily annoyed or irritable 1  Afraid - awful might happen 1  Total GAD 7 Score 5  Anxiety Difficulty Somewhat difficult    Past Medical History:  Diagnosis Date   Anemia    Depression 02/14/20   Still grieving with my father's death and dealing with stressors of life   Headache(784.0)    Hx of chlamydia infection    Seizure (HCC)     History reviewed. No pertinent surgical history.  Family History  Problem Relation Age of Onset   Depression Mother    Hypertension Father    Diabetes Father    COPD Father      Social History   Tobacco Use   Smoking status: Former    Packs/day: 1.00    Years: 5.00    Total pack years: 5.00    Types: Cigarettes    Quit date: 10/01/2014    Years since quitting: 7.3   Smokeless tobacco: Never  Vaping Use   Vaping Use: Some days  Substance Use Topics   Alcohol use:  Yes    Alcohol/week: 3.0 standard drinks of alcohol    Types: 3 Glasses of wine per week    Comment: occasional   Drug use: Not Currently    Frequency: 1.0 times per week    Types: Marijuana    No current outpatient medications on file prior to visit.   No current facility-administered medications on file prior to visit.     Review of Systems  Constitutional:  Positive for fatigue. Negative for fever.  HENT: Negative.    Eyes: Negative.   Respiratory: Negative.    Cardiovascular: Negative.   Gastrointestinal:  Positive for constipation (at times). Negative for abdominal pain, diarrhea, nausea and vomiting.  Genitourinary:  Positive for vaginal discharge. Negative for dysuria and frequency.  Musculoskeletal: Negative.   Skin: Negative.   Neurological:  Positive for seizures (about 2 per month). Negative for dizziness and headaches.   Psychiatric/Behavioral:  Positive for dysphoric mood. The patient is nervous/anxious.       Objective:    BP 110/70 (BP Location: Right Arm, Patient Position: Sitting, Cuff Size: Small)   Pulse 80   Temp 97.6 F (36.4 C) (Temporal)   Ht 5\' 1"  (1.549 m)   Wt 145 lb (65.8 kg)   SpO2 98%   BMI 27.40 kg/m   Wt Readings from Last 3 Encounters:  01/30/22 145 lb (65.8 kg)  04/18/18 134 lb (60.8 kg)  06/02/13 184 lb (83.5 kg)    BP Readings from Last 3 Encounters:  01/30/22 110/70  06/26/18 105/75  04/18/18 138/64    Physical Exam Vitals and nursing note reviewed.  Constitutional:      General: She is not in acute distress.    Appearance: Normal appearance.  HENT:     Head: Normocephalic and atraumatic.     Right Ear: Tympanic membrane, ear canal and external ear normal.     Left Ear: Tympanic membrane, ear canal and external ear normal.  Eyes:     Conjunctiva/sclera: Conjunctivae normal.  Cardiovascular:     Rate and Rhythm: Normal rate and regular rhythm.     Pulses: Normal pulses.     Heart sounds: Normal heart sounds.  Pulmonary:     Effort: Pulmonary effort is normal.     Breath sounds: Normal breath sounds.  Abdominal:     Palpations: Abdomen is soft.     Tenderness: There is no abdominal tenderness.  Musculoskeletal:        General: Normal range of motion.     Cervical back: Normal range of motion and neck supple.     Right lower leg: No edema.     Left lower leg: No edema.  Lymphadenopathy:     Cervical: No cervical adenopathy.  Skin:    General: Skin is warm and dry.  Neurological:     General: No focal deficit present.     Mental Status: She is alert and oriented to person, place, and time.     Cranial Nerves: No cranial nerve deficit.     Coordination: Coordination normal.     Gait: Gait normal.  Psychiatric:        Mood and Affect: Mood normal.        Behavior: Behavior normal.        Thought Content: Thought content normal.        Judgment:  Judgment normal.        Assessment & Plan:   Problem List Items Addressed This Visit  Other   ANEMIA    She has a history of iron deficiency anemia.  Will check CBC and iron panel today.      Relevant Orders   CBC   Comprehensive metabolic panel   Iron, TIBC and Ferritin Panel   Seizures (HCC)    She has a history of seizures however has not been taking medication in the past few years as she lost her insurance and the Dilantin was not working.  Will place a referral to neurology for further evaluation.      Relevant Orders   Ambulatory referral to Neurology   Anxiety and depression - Primary    She has been having some worsening anxiety and depression since last 03/10/2022 when her father passed away.  Her PHQ-9 is a 13 and her GAD-7 is a 5.  She denies SI/HI.  Discussed options and she would be interested in talking with a therapist.  Referral placed.  Follow-up in 3 months or sooner with concerns.      Relevant Orders   TSH   Ambulatory referral to Psychology   Encounter for pre-exposure prophylaxis for HIV    Her partner is currently HIV positive, however taking antivirals and is undetectable.  She is interested in starting PrEP.  Will check CMP, HIV today and start Truvada 1 tablet daily.  Follow-up in 3 months.      Relevant Orders   HIV Antibody (routine testing w rflx)   Other Visit Diagnoses     Screening, lipid       Screen lipid panel today.   Relevant Orders   Lipid panel   Vaginal discharge       Will check urine for gonorrhea, chlamydia, trichomoniasis, BV, yeast.  Treat based on result.  UA negative.   Relevant Orders   POCT urinalysis dipstick (Completed)   Urine cytology ancillary only   Encounter for hepatitis C screening test for low risk patient       Screen for hepatitis C today   Relevant Orders   Hepatitis C antibody   Screening for HIV (human immunodeficiency virus)       Screen for HIV today   Relevant Medications    emtricitabine-tenofovir (TRUVADA) 200-300 MG tablet   Other Relevant Orders   HIV Antibody (routine testing w rflx)        Follow up plan: Return in about 3 months (around 05/01/2022) for CPE.

## 2022-01-30 NOTE — Assessment & Plan Note (Signed)
She has a history of seizures however has not been taking medication in the past few years as she lost her insurance and the Dilantin was not working.  Will place a referral to neurology for further evaluation.

## 2022-01-30 NOTE — Assessment & Plan Note (Signed)
She has been having some worsening anxiety and depression since last 2022-02-13 when her father passed away.  Her PHQ-9 is a 13 and her GAD-7 is a 5.  She denies SI/HI.  Discussed options and she would be interested in talking with a therapist.  Referral placed.  Follow-up in 3 months or sooner with concerns.

## 2022-01-31 LAB — COMPREHENSIVE METABOLIC PANEL
ALT: 14 U/L (ref 0–35)
AST: 15 U/L (ref 0–37)
Albumin: 4.6 g/dL (ref 3.5–5.2)
Alkaline Phosphatase: 35 U/L — ABNORMAL LOW (ref 39–117)
BUN: 12 mg/dL (ref 6–23)
CO2: 25 mEq/L (ref 19–32)
Calcium: 9.1 mg/dL (ref 8.4–10.5)
Chloride: 100 mEq/L (ref 96–112)
Creatinine, Ser: 0.75 mg/dL (ref 0.40–1.20)
GFR: 103.55 mL/min (ref >60.00)
Glucose, Bld: 73 mg/dL (ref 70–99)
Potassium: 3.7 mEq/L (ref 3.5–5.1)
Sodium: 135 mEq/L (ref 135–145)
Total Bilirubin: 1.9 mg/dL — ABNORMAL HIGH (ref 0.2–1.2)
Total Protein: 7.7 g/dL (ref 6.0–8.3)

## 2022-01-31 LAB — LIPID PANEL
Cholesterol: 116 mg/dL (ref 0–200)
HDL: 65.9 mg/dL (ref >39.00)
LDL Cholesterol: 38 mg/dL (ref 0–99)
NonHDL: 50.16
Total CHOL/HDL Ratio: 2
Triglycerides: 61 mg/dL (ref 0.0–149.0)
VLDL: 12.2 mg/dL (ref 0.0–40.0)

## 2022-01-31 LAB — CBC
HCT: 32.5 % — ABNORMAL LOW (ref 36.0–46.0)
Hemoglobin: 10 g/dL — ABNORMAL LOW (ref 12.0–15.0)
MCHC: 30.6 g/dL (ref 30.0–36.0)
MCV: 79 fl (ref 78.0–100.0)
Platelets: 163 10*3/uL (ref 150.0–400.0)
RBC: 4.12 Mil/uL (ref 3.87–5.11)
RDW: 15 % (ref 11.5–15.5)
WBC: 6.3 10*3/uL (ref 4.0–10.5)

## 2022-01-31 LAB — IRON,TIBC AND FERRITIN PANEL
%SAT: 63 % (calc) — ABNORMAL HIGH (ref 16–45)
Ferritin: 28 ng/mL (ref 16–154)
Iron: 148 ug/dL (ref 40–190)
TIBC: 236 mcg/dL (calc) — ABNORMAL LOW (ref 250–450)

## 2022-01-31 LAB — HEPATITIS C ANTIBODY: Hepatitis C Ab: NONREACTIVE

## 2022-01-31 LAB — HIV ANTIBODY (ROUTINE TESTING W REFLEX): HIV 1&2 Ab, 4th Generation: NONREACTIVE

## 2022-02-01 LAB — URINE CYTOLOGY ANCILLARY ONLY
Bacterial Vaginitis-Urine: NEGATIVE
Candida Urine: NEGATIVE
Chlamydia: NEGATIVE
Comment: NEGATIVE
Comment: NEGATIVE
Comment: NORMAL
Neisseria Gonorrhea: NEGATIVE
Trichomonas: NEGATIVE

## 2022-02-01 LAB — TSH: TSH: 2.42 u[IU]/mL (ref 0.35–5.50)

## 2022-02-03 ENCOUNTER — Other Ambulatory Visit: Payer: Self-pay | Admitting: Nurse Practitioner

## 2022-02-10 DIAGNOSIS — H5213 Myopia, bilateral: Secondary | ICD-10-CM | POA: Diagnosis not present

## 2022-02-14 ENCOUNTER — Encounter: Payer: Self-pay | Admitting: Neurology

## 2022-03-14 ENCOUNTER — Ambulatory Visit: Payer: Medicaid Other | Admitting: Neurology

## 2022-04-01 ENCOUNTER — Encounter: Payer: Self-pay | Admitting: Nurse Practitioner

## 2022-04-01 DIAGNOSIS — E663 Overweight: Secondary | ICD-10-CM

## 2022-04-03 ENCOUNTER — Ambulatory Visit: Payer: Medicaid Other | Admitting: Neurology

## 2022-04-03 ENCOUNTER — Encounter: Payer: Self-pay | Admitting: Neurology

## 2022-04-03 VITALS — BP 113/79 | HR 77 | Ht 62.0 in | Wt 153.7 lb

## 2022-04-03 DIAGNOSIS — R569 Unspecified convulsions: Secondary | ICD-10-CM | POA: Diagnosis not present

## 2022-04-03 NOTE — Progress Notes (Signed)
NEUROLOGY CONSULTATION NOTE  Jessica Morgan MRN: [id number] DOB: Nov 02, 1985  Referring provider: Vance Peper, NP Primary care provider: Vance Peper, NP  Reason for consult:  seizure  Thank you for your kind referral of Jessica Morgan for consultation of the above symptoms. Although her history is well known to you, please allow me to reiterate it for the purpose of our medical record. She is alone in the office today. Records and images were personally reviewed where available.   HISTORY OF PRESENT ILLNESS: This is a pleasant 37 year old right-handed woman presenting for evaluation of seizures. Records were reviewed. She reports seizures started around age 72 or 79. She had previously seen neurologist Dr. Leta Baptist however notes are unavailable on [facility]. There is a 24-hour EMU admission at [facility] where notes indicate that she had episodes where she shrugs her shoulders with increased tone, may be unresponsive. She also had episodes of loss of consciousness with shaking. Per notes, she had a normal 24-hour ambulatory EEG with [facility]. From what I can gather, her baseline EEG at [facility] was normal, she had one episode of unresponsiveness with no EEG correlate that was confirmed as a typical episode and Keppra was discontinued. She states that when she was at [facility] "they could not find anything." She states that over the years, she would have frequent seizures when she is stressed out. The last seizure in wakefulness was 2 years ago while she was at work, there was no warning and she was maybe feeling stressed at that time. Other seizure triggers may be sleep deprivation and alcohol. More recently, seizures occur mainly in sleep. Her fiance says she would shake, body gets tense, then she would be sleepy after. No tongue bite. In the past she had urinary incontinence. She has a bad headache after, no focal weakness. Last nocturnal episode was a month ago. In the past she was told she would  be staring and would fall over, her fiance has not mentioned any recent staring spells. She denies any olfactory/gustatory hallucinations, deja vu, rising epigastric sensation, focal numbness/tingling/weakness, myoclonic jerks. She had a normal birth and early development.  There is no history of febrile convulsions, CNS infections such as meningitis/encephalitis, significant traumatic brain injury, neurosurgical procedures, or family history of seizures.  She has occasional headaches occurring 3-4 times a week for th past 3-4 years. There is throbbing pain in the back of her head, no nausea/vomiting, photo/phonophobia. Advil helps a little. No dizziness, diplopia, dysarthria/dysphagia, neck/back pain, bowel/bladder dysfunction. She usually gets 6 hours of sleep. Memory is okay. Mood lately has been depressed, she hopes to see a therapist. She drinks alcohol around 2 times a week. She lives with her mother and son. 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  Prior ASMs: Dilantin, Keppra No prior brain MRI available for review. Head CT in 2011 normal.   PAST MEDICAL HISTORY: Past Medical History:  Diagnosis Date   Anemia    Depression 02/14/20   Still grieving with my father's death and dealing with stressors of life   Headache(784.0)    Hx of chlamydia infection    Seizure ([place])     PAST SURGICAL HISTORY: History reviewed. No pertinent surgical history.  MEDICATIONS: Current Outpatient Medications on File Prior to Visit  Medication Sig Dispense Refill   emtricitabine-tenofovir (TRUVADA) 200-300 MG tablet TAKE 1 TABLET BY MOUTH DAILY 90 tablet 0   No current facility-administered medications on file prior to  visit.    ALLERGIES: No Known Allergies  FAMILY HISTORY: Family History  Problem Relation Age of Onset   Depression Mother    Hypertension Father    Diabetes Father    COPD Father     SOCIAL HISTORY: Social History   Socioeconomic  History   Marital status: Single    Spouse name: Not on file   Number of children: 1   Years of education: Not on file   Highest education level: Not on file  Occupational History   Not on file  Tobacco Use   Smoking status: Former    Packs/day: 1.00    Years: 5.00    Total pack years: 5.00    Types: Cigarettes    Quit date: 10/01/2014    Years since quitting: 7.5   Smokeless tobacco: Never  Vaping Use   Vaping Use: Some days  Substance and Sexual Activity   Alcohol use: Yes    Alcohol/week: 3.0 standard drinks of alcohol    Types: 3 Glasses of wine per week    Comment: occasional   Drug use: Not Currently    Frequency: 1.0 times per week    Types: Marijuana   Sexual activity: Yes    Birth control/protection: Condom  Other Topics Concern   Not on file  Social History Narrative   ** Merged History Encounter **    Are you right handed or left handed?  Right    Are you currently employed ? no   What is your current occupation?   Do you live at home alone? no   Who lives with you? Family    What type of home do you live in: 1 story or 2 story?  1 story        Social Determinants of Radio broadcast assistant Strain: Not on file  Food Insecurity: Not on file  Transportation Needs: Not on file  Physical Activity: Not on file  Stress: Not on file  Social Connections: Not on file  Intimate Partner Violence: Not on file     PHYSICAL EXAM: Vitals:   04/03/22 1045  BP: 113/79  Pulse: 77  SpO2: 99%   General: No acute distress Head:  Normocephalic/atraumatic Skin/Extremities: No rash, no edema Neurological Exam: Mental status: alert and oriented to person, place, and time, no dysarthria or aphasia, Fund of knowledge is appropriate.  Recent and remote memory are intact, 3/3 delayed recall.  Attention and concentration are normal, 5/5 WORLD backwards.     Cranial nerves: CN I: not tested CN II: pupils equal, round, visual fields intact CN III, IV, VI:  full range  of motion, no nystagmus, no ptosis CN V: facial sensation intact CN VII: upper and lower face symmetric CN VIII: hearing intact to conversation Bulk & Tone: normal, no fasciculations. Motor: 5/5 throughout with no pronator drift. Sensation: intact to light touch, cold, pin, vibration sense.  No extinction to double simultaneous stimulation.  Romberg test negative Deep Tendon Reflexes: +1 throughout Cerebellar: no incoordination on finger to nose testing Gait: narrow-based and steady, able to tandem walk adequately. Tremor: none   IMPRESSION: This is a pleasant 37 year old right-handed woman with a history of seizures since age 20/22. 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 She denies any episodes of loss of consciousness in wakefulness in the past 2 years, however reports nocturnal shaking episodes every 1-3 months. She may  have co-existing epileptic and non-epileptic seizures, MRI brain with and without contrast and 1-hour EEG will be ordered. If normal, a 72-hour EEG will be ordered to further classify her seizures. [place] driving laws were discussed with the patient, and she knows to stop driving after a seizure, until 6 months seizure-free. Follow-up after tests, call for any changes.    Thank you for allowing me to participate in the care of this patient. Please do not hesitate to call for any questions or concerns.   Ellouise Newer, M.D.  CC: Vance Peper, NP

## 2022-04-03 NOTE — Patient Instructions (Signed)
liGood to meet you.  Schedule MRI brain with and without contrast  2. Schedule 1-hour EEG. If normal, we will plan for a 3-day home EEG  3. Follow-up after tests, call for any changes   Seizure Precautions: 1. If medication has been prescribed for you to prevent seizures, take it exactly as directed.  Do not stop taking the medicine without talking to your doctor first, even if you have not had a seizure in a long time.   2. Avoid activities in which a seizure would cause danger to yourself or to others.  Don't operate dangerous machinery, swim alone, or climb in high or dangerous places, such as on ladders, roofs, or girders.  Do not drive unless your doctor says you may.  3. If you have any warning that you may have a seizure, lay down in a safe place where you can't hurt yourself.    4.  No driving for 6 months from last seizure, as per [facility].   Please refer to the following link on the [place] website for more information: http://[email or web address]/[email or web address]/drivingu.cfm   5.  Maintain good sleep hygiene. Avoid alcohol.  6.  Notify your neurology if you are planning pregnancy or if you become pregnant.  7.  Contact your doctor if you have any problems that may be related to the medicine you are taking.  8.  Call 911 and bring the patient back to the ED if:        A.  The seizure lasts longer than 5 minutes.       B.  The patient doesn't awaken shortly after the seizure  C.  The patient has new problems such as difficulty seeing, speaking or moving  D.  The patient was injured during the seizure  E.  The patient has a temperature over 102 F (39C)  F.  The patient vomited and now is having trouble breathing

## 2022-04-08 ENCOUNTER — Other Ambulatory Visit: Payer: Medicaid Other

## 2022-04-08 ENCOUNTER — Encounter: Payer: Self-pay | Admitting: Neurology

## 2022-04-10 ENCOUNTER — Encounter: Payer: Self-pay | Admitting: Neurology

## 2022-04-11 ENCOUNTER — Ambulatory Visit (INDEPENDENT_AMBULATORY_CARE_PROVIDER_SITE_OTHER): Payer: Medicaid Other | Admitting: Neurology

## 2022-04-11 DIAGNOSIS — R569 Unspecified convulsions: Secondary | ICD-10-CM | POA: Diagnosis not present

## 2022-04-11 NOTE — Progress Notes (Signed)
EEG complete - results pending 

## 2022-04-21 ENCOUNTER — Ambulatory Visit
Admission: RE | Admit: 2022-04-21 | Discharge: 2022-04-21 | Disposition: A | Discharge status: Home / Self Care | Payer: Medicaid Other | Source: Ambulatory Visit | Attending: Neurology | Admitting: Neurology

## 2022-04-21 DIAGNOSIS — R569 Unspecified convulsions: Secondary | ICD-10-CM

## 2022-04-21 MED ORDER — GADOPICLENOL 0.5 MMOL/ML IV SOLN
7.5000 mL | Freq: Once | INTRAVENOUS | Status: AC | PRN
  Administered 2022-04-21: 7.5 mL via INTRAVENOUS

## 2022-04-22 NOTE — Procedures (Signed)
ELECTROENCEPHALOGRAM REPORT  Date of Study: 04/11/2022  Patient's Name: Sherlin Sicari MRN: [id number] Date of Birth: 08/15/1985  Referring Provider: Dr. Ellouise Newer  Clinical History: This is a 37 year old woman with recurrent nocturnal shaking episodes. EEG for classification.  Medications: Truvada  Technical Summary: A multichannel digital 1-hour EEG recording measured by the international 10-20 system with electrodes applied with paste and impedances below 5000 ohms performed in our laboratory with EKG monitoring in an awake and asleep patient.  Hyperventilation and photic stimulation were performed.  The digital EEG was referentially recorded, reformatted, and digitally filtered in a variety of bipolar and referential montages for optimal display.    Description: The patient is awake and asleep during the recording.  During maximal wakefulness, there is a symmetric, medium voltage 10.5 Hz posterior dominant rhythm that attenuates with eye opening.  The record is symmetric.  During drowsiness and sleep, there is an increase in theta slowing of the background.  Vertex waves and symmetric sleep spindles were seen. Hyperventilation and photic stimulation did not elicit any abnormalities.  There were no epileptiform discharges or electrographic seizures seen.    EKG lead was unremarkable.  Impression: This 1-hour awake and asleep EEG is normal.    Clinical Correlation: A normal EEG does not exclude a clinical diagnosis of epilepsy.  If further clinical questions remain, prolonged EEG may be helpful.  Clinical correlation is advised.   Ellouise Newer, M.D.

## 2022-05-02 ENCOUNTER — Telehealth: Payer: Self-pay

## 2022-05-02 DIAGNOSIS — R569 Unspecified convulsions: Secondary | ICD-10-CM

## 2022-05-02 NOTE — Progress Notes (Deleted)
There were no vitals taken for this visit.   Subjective:    Patient ID: Jessica Morgan, female    DOB: [birth year], 37 y.o.   MRN: [id number]  CC: No chief complaint on file.   HPI: Jessica Morgan is a 37 y.o. female presenting on 05/06/2022 for comprehensive medical examination. Current medical complaints include:{Blank single:19197::"none","***"}  She currently lives with: Menopausal Symptoms: {Blank single:19197::"yes","no"}  Depression and Anxiety Screen done today and results listed below:     01/30/2022    3:38 PM  Depression screen PHQ 2/9  Decreased Interest 1  Down, Depressed, Hopeless 1  PHQ - 2 Score 2  Altered sleeping 3  Tired, decreased energy 2  Change in appetite 3  Feeling bad or failure about yourself  2  Trouble concentrating 0  Moving slowly or fidgety/restless 1  Suicidal thoughts 0  PHQ-9 Score 13  Difficult doing work/chores Somewhat difficult      01/30/2022    3:38 PM  GAD 7 : Generalized Anxiety Score  Nervous, Anxious, on Edge 1  Control/stop worrying 1  Worry too much - different things 1  Trouble relaxing 0  Restless 0  Easily annoyed or irritable 1  Afraid - awful might happen 1  Total GAD 7 Score 5  Anxiety Difficulty Somewhat difficult    The patient {has/does not have:19849} a history of falls. I {did/did not:19850} complete a risk assessment for falls. A plan of care for falls {was/was not:19852} documented.   Past Medical History:  Past Medical History:  Diagnosis Date   Anemia    Depression 02/14/20   Still grieving with my father's death and dealing with stressors of life   Headache(784.0)    Hx of chlamydia infection    Seizure ([place])     Surgical History:  No past surgical history on file.  Medications:  Current Outpatient Medications on File Prior to Visit  Medication Sig   emtricitabine-tenofovir (TRUVADA) 200-300 MG tablet TAKE 1 TABLET BY MOUTH DAILY   No current facility-administered medications on file prior  to visit.    Allergies:  No Known Allergies  Social History:  Social History   Socioeconomic History   Marital status: Single    Spouse name: Not on file   Number of children: 1   Years of education: Not on file   Highest education level: Not on file  Occupational History   Not on file  Tobacco Use   Smoking status: Former    Packs/day: 1.00    Years: 5.00    Additional pack years: 0.00    Total pack years: 5.00    Types: Cigarettes    Quit date: 10/01/2014    Years since quitting: 7.5   Smokeless tobacco: Never  Vaping Use   Vaping Use: Some days  Substance and Sexual Activity   Alcohol use: Yes    Alcohol/week: 3.0 standard drinks of alcohol    Types: 3 Glasses of wine per week    Comment: occasional   Drug use: Not Currently    Frequency: 1.0 times per week    Types: Marijuana   Sexual activity: Yes    Birth control/protection: Condom  Other Topics Concern   Not on file  Social History Narrative   ** Merged History Encounter **    Are you right handed or left handed?  Right    Are you currently employed ? no   What is your current occupation?   Do you live at home alone?  no   Who lives with you? Family    What type of home do you live in: 1 story or 2 story?  1 story        Social Determinants of Radio broadcast assistant Strain: Not on file  Food Insecurity: Not on file  Transportation Needs: Not on file  Physical Activity: Not on file  Stress: Not on file  Social Connections: Not on file  Intimate Partner Violence: Not on file   Social History   Tobacco Use  Smoking Status Former   Packs/day: 1.00   Years: 5.00   Additional pack years: 0.00   Total pack years: 5.00   Types: Cigarettes   Quit date: 10/01/2014   Years since quitting: 7.5  Smokeless Tobacco Never   Social History   Substance and Sexual Activity  Alcohol Use Yes   Alcohol/week: 3.0 standard drinks of alcohol   Types: 3 Glasses of wine per week   Comment: occasional     Family History:  Family History  Problem Relation Age of Onset   Depression Mother    Hypertension Father    Diabetes Father    COPD Father     Past medical history, surgical history, medications, allergies, family history and social history reviewed with patient today and changes made to appropriate areas of the chart.   ROS All other ROS negative except what is listed above and in the HPI.      Objective:    There were no vitals taken for this visit.  Wt Readings from Last 3 Encounters:  04/03/22 153 lb 11.2 oz (69.7 kg)  01/30/22 145 lb (65.8 kg)  04/18/18 134 lb (60.8 kg)    Physical Exam  Results for orders placed or performed in visit on 01/30/22  CBC  Result Value Ref Range   WBC 6.3 4.0 - 10.5 K/uL   RBC 4.12 3.87 - 5.11 Mil/uL   Platelets 163.0 150.0 - 400.0 K/uL   Hemoglobin 10.0 (L) 12.0 - 15.0 g/dL   HCT 32.5 (L) 36.0 - 46.0 %   MCV 79.0 78.0 - 100.0 fl   MCHC 30.6 30.0 - 36.0 g/dL   RDW 15.0 11.5 - 15.5 %  Comprehensive metabolic panel  Result Value Ref Range   Sodium 135 135 - 145 mEq/L   Potassium 3.7 3.5 - 5.1 mEq/L   Chloride 100 96 - 112 mEq/L   CO2 25 19 - 32 mEq/L   Glucose, Bld 73 70 - 99 mg/dL   BUN 12 6 - 23 mg/dL   Creatinine, Ser 0.75 0.40 - 1.20 mg/dL   Total Bilirubin 1.9 (H) 0.2 - 1.2 mg/dL   Alkaline Phosphatase 35 (L) 39 - 117 U/L   AST 15 0 - 37 U/L   ALT 14 0 - 35 U/L   Total Protein 7.7 6.0 - 8.3 g/dL   Albumin 4.6 3.5 - 5.2 g/dL   GFR 103.55 >60.00 mL/min   Calcium 9.1 8.4 - 10.5 mg/dL  Lipid panel  Result Value Ref Range   Cholesterol 116 0 - 200 mg/dL   Triglycerides 61.0 0.0 - 149.0 mg/dL   HDL 65.90 >39.00 mg/dL   VLDL 12.2 0.0 - 40.0 mg/dL   LDL Cholesterol 38 0 - 99 mg/dL   Total CHOL/HDL Ratio 2    NonHDL 50.16   Hepatitis C antibody  Result Value Ref Range   Hepatitis C Ab NON-REACTIVE NON-REACTIVE  HIV Antibody (routine testing w rflx)  Result Value Ref  Range   HIV 1&2 Ab, 4th Generation NON-REACTIVE  NON-REACTIVE  TSH  Result Value Ref Range   TSH 2.42 0.35 - 5.50 uIU/mL  Iron, TIBC and Ferritin Panel  Result Value Ref Range   Iron 148 40 - 190 mcg/dL   TIBC 236 (L) 250 - 450 mcg/dL (calc)   %SAT 63 (H) 16 - 45 % (calc)   Ferritin 28 16 - 154 ng/mL  POCT urinalysis dipstick  Result Value Ref Range   Color, UA yellow    Clarity, UA clear    Glucose, UA Negative Negative   Bilirubin, UA neg    Ketones, UA neg    Spec Grav, UA 1.010 1.010 - 1.025   Blood, UA neg    pH, UA 6.0 5.0 - 8.0   Protein, UA Negative Negative   Urobilinogen, UA 0.2 0.2 or 1.0 E.U./dL   Nitrite, UA pos    Leukocytes, UA Negative Negative   Appearance clear    Odor no   Urine cytology ancillary only  Result Value Ref Range   Neisseria Gonorrhea Negative    Chlamydia Negative    Trichomonas Negative    Bacterial Vaginitis-Urine Negative    Candida Urine Negative    Molecular Comment      For tests bacteria and/or candida, this specimen does not meet the   Molecular Comment      strict criteria set by the FDA. The result interpretation should be   Molecular Comment      considered in conjunction with the patient's clinical history.   Comment Normal Reference Range Trichomonas - Negative    Comment Normal Reference Ranger Chlamydia - Negative    Comment      Normal Reference Range Neisseria Gonorrhea - Negative      Assessment & Plan:   Problem List Items Addressed This Visit   None    Follow up plan: No follow-ups on file.   LABORATORY TESTING:  - Pap smear: {Blank [id number] done","not applicable","up to date","done elsewhere"}  IMMUNIZATIONS:   - Tdap: Tetanus vaccination status reviewed: last tetanus booster within 10 years. - Influenza: Refused - Pneumovax: Not applicable - Prevnar: Not applicable - HPV: Not applicable - Zostavax vaccine: Not applicable  SCREENING: -Mammogram: Not applicable  - Colonoscopy: Not applicable  - Bone Density: Not applicable   PATIENT  COUNSELING:   Advised to take 1 mg of folate supplement per day if capable of pregnancy.   Sexuality: Discussed sexually transmitted diseases, partner selection, use of condoms, avoidance of unintended pregnancy  and contraceptive alternatives.   Advised to avoid cigarette smoking.  I discussed with the patient that most people either abstain from alcohol or drink within safe limits (<=14/week and <=4 drinks/occasion for males, <=7/weeks and <= 3 drinks/occasion for females) and that the risk for alcohol disorders and other health effects rises proportionally with the number of drinks per week and how often a drinker exceeds daily limits.  Discussed cessation/primary prevention of drug use and availability of treatment for abuse.   Diet: Encouraged to adjust caloric intake to maintain  or achieve ideal body weight, to reduce intake of dietary saturated fat and total fat, to limit sodium intake by avoiding high sodium foods and not adding table salt, and to maintain adequate dietary potassium and calcium preferably from fresh fruits, vegetables, and low-fat dairy products.    stressed the importance of regular exercise  Injury prevention: Discussed safety belts, safety helmets, smoke detector, smoking near bedding or upholstery.  Dental health: Discussed importance of regular tooth brushing, flossing, and dental visits.    NEXT PREVENTATIVE PHYSICAL DUE IN 1 YEAR. No follow-ups on file.

## 2022-05-02 NOTE — Telephone Encounter (Signed)
-----   Message from Cameron Sprang, MD sent at 04/30/2022  4:41 PM EDT ----- Pls let her know the EEG is normal, however it is not like a pregnancy test that is positive or negative, just a snapshot of her brain waves during the test. The brain MRI did not show any tumor, stroke, or bleed. It showed non-specific white spots that can be seen in patients with headaches and would not cause seizures. I would like to proceed with 3-day EEG, if she agrees, pls order 72-hour EEG. Thanks

## 2022-05-02 NOTE — Telephone Encounter (Signed)
Pt called an informed EEG is normal, however it is not like a pregnancy test that is positive or negative, just a snapshot of her brain waves during the test. The brain MRI did not show any tumor, stroke, or bleed. It showed non-specific white spots that can be seen in patients with headaches and would not cause seizures. Pt would like to do the 72 hour eeg

## 2022-05-06 ENCOUNTER — Telehealth: Payer: Self-pay | Admitting: Nurse Practitioner

## 2022-05-06 ENCOUNTER — Encounter: Payer: Medicaid Other | Admitting: Nurse Practitioner

## 2022-05-06 DIAGNOSIS — R569 Unspecified convulsions: Secondary | ICD-10-CM

## 2022-05-06 DIAGNOSIS — F32A Depression, unspecified: Secondary | ICD-10-CM

## 2022-05-06 DIAGNOSIS — Z0000 Encounter for general adult medical examination without abnormal findings: Secondary | ICD-10-CM

## 2022-05-06 DIAGNOSIS — Z2981 Encounter for HIV pre-exposure prophylaxis: Secondary | ICD-10-CM

## 2022-05-06 DIAGNOSIS — D509 Iron deficiency anemia, unspecified: Secondary | ICD-10-CM

## 2022-05-06 NOTE — Telephone Encounter (Signed)
Noted  

## 2022-05-06 NOTE — Telephone Encounter (Signed)
Patient/Caregiver was notified of No Show/Late Cancellation Policy & possible [id number] charge. Visit was cancelled with reason "No Show/Cancel within 24 hours" for tracking & charging.  Caller Name: Jamillia Bendell Ph #: [phone] Date of APPT: 4/1 Reason given for no show/late cancellation: none given No Show Letter printed & put in outgoing mail (Yes/No): yes  ~~~Route message to admin supervisor and clinical team/CMA~~~

## 2022-05-09 NOTE — Telephone Encounter (Signed)
Noted  

## 2022-05-09 NOTE — Telephone Encounter (Signed)
1st no show, fee waived (Medicaid), letter sent

## 2022-05-10 ENCOUNTER — Other Ambulatory Visit: Payer: Medicaid Other

## 2022-05-22 ENCOUNTER — Other Ambulatory Visit (HOSPITAL_COMMUNITY)
Admission: RE | Admit: 2022-05-22 | Discharge: 2022-05-22 | Disposition: A | Discharge status: Home / Self Care | Payer: Medicaid Other | Source: Ambulatory Visit | Attending: Nurse Practitioner | Admitting: Nurse Practitioner

## 2022-05-22 ENCOUNTER — Encounter: Payer: Self-pay | Admitting: Nurse Practitioner

## 2022-05-22 ENCOUNTER — Ambulatory Visit (INDEPENDENT_AMBULATORY_CARE_PROVIDER_SITE_OTHER): Payer: Medicaid Other | Admitting: Nurse Practitioner

## 2022-05-22 VITALS — BP 102/60 | HR 68 | Temp 96.8°F | Ht 62.0 in | Wt 147.0 lb

## 2022-05-22 DIAGNOSIS — R569 Unspecified convulsions: Secondary | ICD-10-CM

## 2022-05-22 DIAGNOSIS — F419 Anxiety disorder, unspecified: Secondary | ICD-10-CM

## 2022-05-22 DIAGNOSIS — D649 Anemia, unspecified: Secondary | ICD-10-CM | POA: Diagnosis not present

## 2022-05-22 DIAGNOSIS — Z3009 Encounter for other general counseling and advice on contraception: Secondary | ICD-10-CM | POA: Diagnosis not present

## 2022-05-22 DIAGNOSIS — F32A Depression, unspecified: Secondary | ICD-10-CM | POA: Diagnosis not present

## 2022-05-22 DIAGNOSIS — Z0000 Encounter for general adult medical examination without abnormal findings: Secondary | ICD-10-CM

## 2022-05-22 DIAGNOSIS — Z124 Encounter for screening for malignant neoplasm of cervix: Secondary | ICD-10-CM | POA: Insufficient documentation

## 2022-05-22 DIAGNOSIS — Z2981 Encounter for HIV pre-exposure prophylaxis: Secondary | ICD-10-CM

## 2022-05-22 DIAGNOSIS — R87612 Low grade squamous intraepithelial lesion on cytologic smear of cervix (LGSIL): Secondary | ICD-10-CM

## 2022-05-22 LAB — CBC
HCT: 33.5 % — ABNORMAL LOW (ref 36.0–46.0)
Hemoglobin: 10.4 g/dL — ABNORMAL LOW (ref 12.0–15.0)
MCHC: 31 g/dL (ref 30.0–36.0)
MCV: 78.7 fl (ref 78.0–100.0)
Platelets: 172 10*3/uL (ref 150.0–400.0)
RBC: 4.26 Mil/uL (ref 3.87–5.11)
RDW: 14.3 % (ref 11.5–15.5)
WBC: 5 10*3/uL (ref 4.0–10.5)

## 2022-05-22 LAB — COMPREHENSIVE METABOLIC PANEL
ALT: 11 U/L (ref 0–35)
AST: 16 U/L (ref 0–37)
Albumin: 4.7 g/dL (ref 3.5–5.2)
Alkaline Phosphatase: 32 U/L — ABNORMAL LOW (ref 39–117)
BUN: 15 mg/dL (ref 6–23)
CO2: 23 mEq/L (ref 19–32)
Calcium: 9.3 mg/dL (ref 8.4–10.5)
Chloride: 104 mEq/L (ref 96–112)
Creatinine, Ser: 0.77 mg/dL (ref 0.40–1.20)
GFR: 100.12 mL/min (ref >60.00)
Glucose, Bld: 88 mg/dL (ref 70–99)
Potassium: 3.7 mEq/L (ref 3.5–5.1)
Sodium: 139 mEq/L (ref 135–145)
Total Bilirubin: 1.4 mg/dL — ABNORMAL HIGH (ref 0.2–1.2)
Total Protein: 7.8 g/dL (ref 6.0–8.3)

## 2022-05-22 MED ORDER — EMTRICITABINE-TENOFOVIR DF 200-300 MG PO TABS: 1.0000 | ORAL_TABLET | Freq: Every day | ORAL | 0 refills | Status: DC

## 2022-05-22 MED ORDER — NORELGESTROMIN-ETH ESTRADIOL 150-35 MCG/24HR TD PTWK: 1.0000 | MEDICATED_PATCH | TRANSDERMAL | 12 refills | Status: DC

## 2022-05-22 NOTE — Assessment & Plan Note (Signed)
Discussed various options of birth control and she would like to do the patch.  Xulane patch sent to the pharmacy.  Instructed her to start this on the first day of her next menstrual period.  Follow-up with any concerns.

## 2022-05-22 NOTE — Progress Notes (Signed)
BP 102/60 (BP Location: Left Arm)   Pulse 68   Temp (!) 96.8 F (36 C)   Ht  (1.575 m)   Wt 147 lb (66.7 kg)   LMP 04/30/2022 (Exact Date)   SpO2 97%   BMI 26.89 kg/m    Subjective:    Patient ID: Jessica Morgan, female    DOB: [birth year], 37 y.o.   MRN: [id number]  CC: Chief Complaint  Patient presents with   Annual Exam    With fasting labs, discuss birth control options    HPI: Jessica Morgan is a 37 y.o. female presenting on 05/22/2022 for comprehensive medical examination. Current medical complaints include: birth control options  She would like to discuss birth control options. She has not taken any birth control in the past. She has been using plan B, however it is causing her periods to be irregular.   She currently lives with: mom, son Menopausal Symptoms: no  Depression and Anxiety Screen done today and results listed below:     05/22/2022    9:31 AM 01/30/2022    3:38 PM  Depression screen PHQ 2/9  Decreased Interest 0 1  Down, Depressed, Hopeless 1 1  PHQ - 2 Score 1 2  Altered sleeping 2 3  Tired, decreased energy 0 2  Change in appetite 0 3  Feeling bad or failure about yourself  1 2  Trouble concentrating 0 0  Moving slowly or fidgety/restless 0 1  Suicidal thoughts 0 0  PHQ-9 Score 4 13  Difficult doing work/chores Somewhat difficult Somewhat difficult      05/22/2022    9:32 AM 01/30/2022    3:38 PM  GAD 7 : Generalized Anxiety Score  Nervous, Anxious, on Edge 1 1  Control/stop worrying 1 1  Worry too much - different things 1 1  Trouble relaxing 1 0  Restless 1 0  Easily annoyed or irritable 1 1  Afraid - awful might happen 0 1  Total GAD 7 Score 6 5  Anxiety Difficulty Somewhat difficult Somewhat difficult    The patient does not have a history of falls. I did not complete a risk assessment for falls. A plan of care for falls was not documented.   Past Medical History:  Past Medical History:  Diagnosis Date   Anemia     Depression 02/14/20   Still grieving with my father's death and dealing with stressors of life   Headache(784.0)    Hx of chlamydia infection    Seizure     Surgical History:  History reviewed. No pertinent surgical history.  Medications:  No current outpatient medications on file prior to visit.   No current facility-administered medications on file prior to visit.    Allergies:  No Known Allergies  Social History:  Social History   Socioeconomic History   Marital status: Single    Spouse name: Not on file   Number of children: 1   Years of education: Not on file   Highest education level: Not on file  Occupational History   Not on file  Tobacco Use   Smoking status: Former    Packs/day: 1.00    Years: 5.00    Additional pack years: 0.00    Total pack years: 5.00    Types: Cigarettes    Quit date: 10/01/2014    Years since quitting: 7.6   Smokeless tobacco: Never  Vaping Use   Vaping Use: Some days  Substance and Sexual Activity  Alcohol use: Yes    Alcohol/week: 3.0 standard drinks of alcohol    Types: 3 Glasses of wine per week    Comment: occasional   Drug use: Not Currently    Frequency: 1.0 times per week    Types: Marijuana   Sexual activity: Yes    Birth control/protection: Condom  Other Topics Concern   Not on file  Social History Narrative   ** Merged History Encounter **    Are you right handed or left handed?  Right    Are you currently employed ? no   What is your current occupation?   Do you live at home alone? no   Who lives with you? Family    What type of home do you live in: 1 story or 2 story?  1 story        Social Determinants of Corporate investment banker Strain: Not on file  Food Insecurity: Not on file  Transportation Needs: Not on file  Physical Activity: Not on file  Stress: Not on file  Social Connections: Not on file  Intimate Partner Violence: Not on file   Social History   Tobacco Use  Smoking Status Former    Packs/day: 1.00   Years: 5.00   Additional pack years: 0.00   Total pack years: 5.00   Types: Cigarettes   Quit date: 10/01/2014   Years since quitting: 7.6  Smokeless Tobacco Never   Social History   Substance and Sexual Activity  Alcohol Use Yes   Alcohol/week: 3.0 standard drinks of alcohol   Types: 3 Glasses of wine per week   Comment: occasional    Family History:  Family History  Problem Relation Age of Onset   Depression Mother    Hypertension Father    Diabetes Father    COPD Father     Past medical history, surgical history, medications, allergies, family history and social history reviewed with patient today and changes made to appropriate areas of the chart.   Review of Systems  Constitutional: Negative.   HENT: Negative.    Eyes: Negative.   Respiratory: Negative.    Cardiovascular: Negative.   Gastrointestinal: Negative.   Genitourinary: Negative.   Musculoskeletal: Negative.   Skin: Negative.   Neurological: Negative.   Psychiatric/Behavioral:  Positive for depression. The patient is nervous/anxious.    All other ROS negative except what is listed above and in the HPI.      Objective:    BP 102/60 (BP Location: Left Arm)   Pulse 68   Temp (!) 96.8 F (36 C)   Ht  (1.575 m)   Wt 147 lb (66.7 kg)   LMP 04/30/2022 (Exact Date)   SpO2 97%   BMI 26.89 kg/m   Wt Readings from Last 3 Encounters:  05/22/22 147 lb (66.7 kg)  04/03/22 153 lb 11.2 oz (69.7 kg)  01/30/22 145 lb (65.8 kg)    Physical Exam Vitals and nursing note reviewed. Exam conducted with a chaperone present.  Constitutional:      General: She is not in acute distress.    Appearance: Normal appearance.  HENT:     Head: Normocephalic and atraumatic.     Right Ear: Tympanic membrane, ear canal and external ear normal.     Left Ear: Tympanic membrane, ear canal and external ear normal.  Eyes:     Conjunctiva/sclera: Conjunctivae normal.  Cardiovascular:     Rate and  Rhythm: Normal rate and regular rhythm.  Pulses: Normal pulses.     Heart sounds: Normal heart sounds.  Pulmonary:     Effort: Pulmonary effort is normal.     Breath sounds: Normal breath sounds.  Abdominal:     Palpations: Abdomen is soft.     Tenderness: There is no abdominal tenderness.  Genitourinary:    General: Normal vulva.     Exam position: Lithotomy position.     Labia:        Right: No rash, tenderness or lesion.        Left: No rash, tenderness or lesion.      Vagina: Normal.     Cervix: Normal.     Uterus: Normal.      Adnexa: Right adnexa normal and left adnexa normal.  Musculoskeletal:        General: Normal range of motion.     Cervical back: Normal range of motion and neck supple.     Right lower leg: No edema.     Left lower leg: No edema.  Lymphadenopathy:     Cervical: No cervical adenopathy.  Skin:    General: Skin is warm and dry.  Neurological:     General: No focal deficit present.     Mental Status: She is alert and oriented to person, place, and time.     Cranial Nerves: No cranial nerve deficit.     Coordination: Coordination normal.     Gait: Gait normal.  Psychiatric:        Mood and Affect: Mood normal.        Behavior: Behavior normal.        Thought Content: Thought content normal.        Judgment: Judgment normal.     Results for orders placed or performed in visit on 01/30/22  CBC  Result Value Ref Range   WBC 6.3 4.0 - 10.5 K/uL   RBC 4.12 3.87 - 5.11 Mil/uL   Platelets 163.0 150.0 - 400.0 K/uL   Hemoglobin 10.0 (L) 12.0 - 15.0 g/dL   HCT 16.1 (L) 09.6 - 04.5 %   MCV 79.0 78.0 - 100.0 fl   MCHC 30.6 30.0 - 36.0 g/dL   RDW 40.9 81.1 - 91.4 %  Comprehensive metabolic panel  Result Value Ref Range   Sodium 135 135 - 145 mEq/L   Potassium 3.7 3.5 - 5.1 mEq/L   Chloride 100 96 - 112 mEq/L   CO2 25 19 - 32 mEq/L   Glucose, Bld 73 70 - 99 mg/dL   BUN 12 6 - 23 mg/dL   Creatinine, Ser 7.82 0.40 - 1.20 mg/dL   Total Bilirubin  1.9 (H) 0.2 - 1.2 mg/dL   Alkaline Phosphatase 35 (L) 39 - 117 U/L   AST 15 0 - 37 U/L   ALT 14 0 - 35 U/L   Total Protein 7.7 6.0 - 8.3 g/dL   Albumin 4.6 3.5 - 5.2 g/dL   GFR 956.21 >30.86 mL/min   Calcium 9.1 8.4 - 10.5 mg/dL  Lipid panel  Result Value Ref Range   Cholesterol 116 0 - 200 mg/dL   Triglycerides 57.8 0.0 - 149.0 mg/dL   HDL 46.96 >29.52 mg/dL   VLDL 84.1 0.0 - 32.4 mg/dL   LDL Cholesterol 38 0 - 99 mg/dL   Total CHOL/HDL Ratio 2    NonHDL 50.16   Hepatitis C antibody  Result Value Ref Range   Hepatitis C Ab NON-REACTIVE NON-REACTIVE  HIV Antibody (routine testing w rflx)  Result  Value Ref Range   HIV 1&2 Ab, 4th Generation NON-REACTIVE NON-REACTIVE  TSH  Result Value Ref Range   TSH 2.42 0.35 - 5.50 uIU/mL  Iron, TIBC and Ferritin Panel  Result Value Ref Range   Iron 148 40 - 190 mcg/dL   TIBC 960 (L) 454 - 098 mcg/dL (calc)   %SAT 63 (H) 16 - 45 % (calc)   Ferritin 28 16 - 154 ng/mL  POCT urinalysis dipstick  Result Value Ref Range   Color, UA yellow    Clarity, UA clear    Glucose, UA Negative Negative   Bilirubin, UA neg    Ketones, UA neg    Spec Grav, UA 1.010 1.010 - 1.025   Blood, UA neg    pH, UA 6.0 5.0 - 8.0   Protein, UA Negative Negative   Urobilinogen, UA 0.2 0.2 or 1.0 E.U./dL   Nitrite, UA pos    Leukocytes, UA Negative Negative   Appearance clear    Odor no   Urine cytology ancillary only  Result Value Ref Range   Neisseria Gonorrhea Negative    Chlamydia Negative    Trichomonas Negative    Bacterial Vaginitis-Urine Negative    Candida Urine Negative    Molecular Comment      For tests bacteria and/or candida, this specimen does not meet the   Molecular Comment      strict criteria set by the FDA. The result interpretation should be   Molecular Comment      considered in conjunction with the patient's clinical history.   Comment Normal Reference Range Trichomonas - Negative    Comment Normal Reference Ranger Chlamydia -  Negative    Comment      Normal Reference Range Neisseria Gonorrhea - Negative      Assessment & Plan:   Problem List Items Addressed This Visit       Other   ANEMIA    She has history of anemia, will check CBC, iron panel, vitamin B12 today.      Relevant Orders   CBC   Vitamin B12   Iron, TIBC and Ferritin Panel   Seizures    Chronic, stable.  She has not had a seizure recently and is following with neurology.      Anxiety and depression    Chronic, ongoing.  She states that the therapist she was referred to does not take Medicaid.  Will reach out to our referral coordinator to get this moved to a different office.  She denies SI/HI.  Overall her symptoms are stable.  Follow-up in 3 months.      Encounter for pre-exposure prophylaxis for HIV    She is tolerating the Truvada well, will continue this, refill sent to the pharmacy.  Check CMP, HIV today.      Relevant Orders   HIV Antibody (routine testing w rflx)   Routine general medical examination at a health care facility - Primary    Health maintenance reviewed and updated. Discussed nutrition, exercise. Check CMP, CBC, TSH today. Follow-up 1 year.        Relevant Orders   Comprehensive metabolic panel   CBC   TSH   Birth control counseling    Discussed various options of birth control and she would like to do the patch.  Xulane patch sent to the pharmacy.  Instructed her to start this on the first day of her next menstrual period.  Follow-up with any concerns.  Other Visit Diagnoses     Screening for cervical cancer       Pap with STD testing done today   Relevant Orders   Cytology - PAP        Follow up plan: Return in about 3 months (around 08/21/2022) for Depression, Anxiety.   LABORATORY TESTING:  - Pap smear: pap done  IMMUNIZATIONS:   - Tdap: Tetanus vaccination status reviewed: last tetanus booster within 10 years. - Influenza: Postponed to flu season - Pneumovax: Not applicable -  Prevnar: Not applicable - HPV: Not applicable - Zostavax vaccine: Not applicable  SCREENING: -Mammogram: Not applicable  - Colonoscopy: Not applicable  - Bone Density: Not applicable   PATIENT COUNSELING:   Advised to take 1 mg of folate supplement per day if capable of pregnancy.   Sexuality: Discussed sexually transmitted diseases, partner selection, use of condoms, avoidance of unintended pregnancy  and contraceptive alternatives.   Advised to avoid cigarette smoking.  I discussed with the patient that most people either abstain from alcohol or drink within safe limits (<=14/week and <=4 drinks/occasion for males, <=7/weeks and <= 3 drinks/occasion for females) and that the risk for alcohol disorders and other health effects rises proportionally with the number of drinks per week and how often a drinker exceeds daily limits.  Discussed cessation/primary prevention of drug use and availability of treatment for abuse.   Diet: Encouraged to adjust caloric intake to maintain  or achieve ideal body weight, to reduce intake of dietary saturated fat and total fat, to limit sodium intake by avoiding high sodium foods and not adding table salt, and to maintain adequate dietary potassium and calcium preferably from fresh fruits, vegetables, and low-fat dairy products.    stressed the importance of regular exercise  Injury prevention: Discussed safety belts, safety helmets, smoke detector, smoking near bedding or upholstery.   Dental health: Discussed importance of regular tooth brushing, flossing, and dental visits.    NEXT PREVENTATIVE PHYSICAL DUE IN 1 YEAR. Return in about 3 months (around 08/21/2022) for Depression, Anxiety.

## 2022-05-22 NOTE — Assessment & Plan Note (Signed)
Health maintenance reviewed and updated. Discussed nutrition, exercise. Check CMP, CBC, TSH today. Follow-up 1 year.   

## 2022-05-22 NOTE — Assessment & Plan Note (Signed)
Chronic, stable.  She has not had a seizure recently and is following with neurology.

## 2022-05-22 NOTE — Assessment & Plan Note (Signed)
She has history of anemia, will check CBC, iron panel, vitamin B12 today.

## 2022-05-22 NOTE — Patient Instructions (Signed)
It was great to see you!  We are checking your labs today and will let you know the results via mychart/phone.   Start birth control patch once a week for 3 weeks, then 1 week off.   Let's follow-up in 3 months, sooner if you have concerns.  If a referral was placed today, you will be contacted for an appointment. Please note that routine referrals can sometimes take up to 3-4 weeks to process. Please call our office if you haven't heard anything after this time frame.  Take care,  Rodman Pickle, NP'

## 2022-05-22 NOTE — Assessment & Plan Note (Signed)
Chronic, ongoing.  She states that the therapist she was referred to does not take Medicaid.  Will reach out to our referral coordinator to get this moved to a different office.  She denies SI/HI.  Overall her symptoms are stable.  Follow-up in 3 months.

## 2022-05-22 NOTE — Assessment & Plan Note (Signed)
She is tolerating the Truvada well, will continue this, refill sent to the pharmacy.  Check CMP, HIV today.

## 2022-05-23 ENCOUNTER — Encounter: Payer: Medicaid Other | Admitting: Nurse Practitioner

## 2022-05-23 LAB — TSH: TSH: 3.07 u[IU]/mL (ref 0.35–5.50)

## 2022-05-23 LAB — VITAMIN B12: Vitamin B-12: 470 pg/mL (ref 211–911)

## 2022-05-24 LAB — HIV ANTIBODY (ROUTINE TESTING W REFLEX): HIV 1&2 Ab, 4th Generation: NONREACTIVE

## 2022-05-24 LAB — IRON,TIBC AND FERRITIN PANEL
%SAT: 39 % (calc) (ref 16–45)
Ferritin: 18 ng/mL (ref 16–154)
Iron: 103 ug/dL (ref 40–190)
TIBC: 263 mcg/dL (calc) (ref 250–450)

## 2022-05-28 ENCOUNTER — Encounter: Payer: Self-pay | Admitting: Nurse Practitioner

## 2022-05-28 NOTE — Addendum Note (Signed)
Addended by: Rodman Pickle A on: 05/28/2022 09:06 PM   Modules accepted: Orders

## 2022-05-30 LAB — CYTOLOGY - PAP
Chlamydia: NEGATIVE
Comment: NEGATIVE
Comment: NEGATIVE
Comment: NEGATIVE
Comment: NEGATIVE
Comment: NORMAL
HPV 16: NEGATIVE
HPV 18 / 45: NEGATIVE
High risk HPV: POSITIVE — AB
Neisseria Gonorrhea: NEGATIVE

## 2022-07-18 ENCOUNTER — Other Ambulatory Visit (HOSPITAL_COMMUNITY)
Admission: RE | Admit: 2022-07-18 | Discharge: 2022-07-18 | Disposition: A | Discharge status: Home / Self Care | Payer: Medicaid Other | Source: Ambulatory Visit | Attending: Obstetrics and Gynecology | Admitting: Obstetrics and Gynecology

## 2022-07-18 ENCOUNTER — Encounter: Payer: Self-pay | Admitting: Obstetrics and Gynecology

## 2022-07-18 ENCOUNTER — Ambulatory Visit: Payer: Medicaid Other | Admitting: Obstetrics and Gynecology

## 2022-07-18 VITALS — BP 97/65 | HR 68 | Ht 62.0 in | Wt 146.6 lb

## 2022-07-18 DIAGNOSIS — Z3202 Encounter for pregnancy test, result negative: Secondary | ICD-10-CM | POA: Diagnosis not present

## 2022-07-18 DIAGNOSIS — R87612 Low grade squamous intraepithelial lesion on cytologic smear of cervix (LGSIL): Secondary | ICD-10-CM | POA: Insufficient documentation

## 2022-07-18 DIAGNOSIS — R87810 Cervical high risk human papillomavirus (HPV) DNA test positive: Secondary | ICD-10-CM

## 2022-07-18 DIAGNOSIS — N870 Mild cervical dysplasia: Secondary | ICD-10-CM | POA: Diagnosis not present

## 2022-07-18 DIAGNOSIS — B977 Papillomavirus as the cause of diseases classified elsewhere: Secondary | ICD-10-CM | POA: Insufficient documentation

## 2022-07-18 LAB — POCT URINE PREGNANCY: Preg Test, Ur: NEGATIVE

## 2022-07-18 NOTE — Progress Notes (Signed)
Colposcopy Procedure Note  Jessica Morgan is a 37 y.o. G1P1001 here for colposcopy.  Indications:  Pap with LSIL, positive high risk HPV  Procedure Details  LMP 07/17/22; UPT negative.    The risks (including infection, bleeding, pain) and benefits of the procedure were explained to the patient and written informed consent was obtained.  The patient was placed in the dorsal lithotomy position. A Graves was speculum inserted in the vagina, and the cervix was visualized.  The cervix was stained with acetic acid and visualized using the colposcope under magnification as well as with a green filter. Findings as below. Cervical biopsies were taken at 2, 4, 9 o'clock. Endocervical curettage then performed in all four quadrants. Small amount of bleeding noted that improved with pressure. Monsel's solution applied with good hemostasis noted. Patient tolerating procedure well.  Findings: acetowhite at 2, 4, 9 o'clock, significant ectropion  Impression: low grade  Adequate: yes  Specimens:  Cervical biopsy at 2, 4, 9 o'clock  Endocervical curettage  Condition: Stable  Complications: None  Plan: The patient was advised to call for any fever or for prolonged or severe pain or bleeding. She was advised to use OTC analgesics as needed for mild to moderate pain. She was advised to avoid vaginal intercourse for 48 hours or until the bleeding has completely stopped.  Will base further management on results of biopsy.   Baldemar Lenis, MD, [facility] Attending Center for [facility])

## 2022-07-19 LAB — SURGICAL PATHOLOGY

## 2022-08-21 ENCOUNTER — Encounter: Payer: Self-pay | Admitting: Nurse Practitioner

## 2022-08-21 ENCOUNTER — Ambulatory Visit: Payer: Medicaid Other | Admitting: Nurse Practitioner

## 2022-08-21 VITALS — BP 108/70 | HR 79 | Temp 97.5°F | Ht 62.0 in | Wt 145.0 lb

## 2022-08-21 DIAGNOSIS — Z2981 Encounter for HIV pre-exposure prophylaxis: Secondary | ICD-10-CM | POA: Diagnosis not present

## 2022-08-21 DIAGNOSIS — F419 Anxiety disorder, unspecified: Secondary | ICD-10-CM | POA: Diagnosis not present

## 2022-08-21 DIAGNOSIS — F32A Depression, unspecified: Secondary | ICD-10-CM

## 2022-08-21 DIAGNOSIS — Z3009 Encounter for other general counseling and advice on contraception: Secondary | ICD-10-CM | POA: Diagnosis not present

## 2022-08-21 DIAGNOSIS — N926 Irregular menstruation, unspecified: Secondary | ICD-10-CM

## 2022-08-21 LAB — COMPREHENSIVE METABOLIC PANEL
ALT: 14 U/L (ref 0–35)
AST: 17 U/L (ref 0–37)
Albumin: 4.6 g/dL (ref 3.5–5.2)
Alkaline Phosphatase: 37 U/L — ABNORMAL LOW (ref 39–117)
BUN: 10 mg/dL (ref 6–23)
CO2: 23 mEq/L (ref 19–32)
Calcium: 9.3 mg/dL (ref 8.4–10.5)
Chloride: 105 mEq/L (ref 96–112)
Creatinine, Ser: 0.75 mg/dL (ref 0.40–1.20)
GFR: 103.15 mL/min (ref >60.00)
Glucose, Bld: 94 mg/dL (ref 70–99)
Potassium: 4.2 mEq/L (ref 3.5–5.1)
Sodium: 137 mEq/L (ref 135–145)
Total Bilirubin: 1.6 mg/dL — ABNORMAL HIGH (ref 0.2–1.2)
Total Protein: 7.5 g/dL (ref 6.0–8.3)

## 2022-08-21 NOTE — Assessment & Plan Note (Signed)
Home pregnancy test positive. Will check beta Hcg per request. Start prenatal vitamin daily. Info given on planned parenthood and [facility]. Discussed abortion laws in [place] need to be done before 12 weeks pregnancy.

## 2022-08-21 NOTE — Assessment & Plan Note (Signed)
Continue Truvada daily.  Will check CMP and HIV today.

## 2022-08-21 NOTE — Assessment & Plan Note (Signed)
Discussed starting a different birth control option either after termination or completed pregnancy.  She may be interested in starting the pill.  Encouraged her to reach out after following up with GYN or [facility]

## 2022-08-21 NOTE — Progress Notes (Signed)
Established Patient Office Visit  Subjective   Patient ID: Jessica Morgan, female    DOB: 13-Jan-1986  Age: 37 y.o. MRN: [id number]  Chief Complaint  Patient presents with   Anxiety    Follow up, request a pregnancy test-did 2 test at home and was positive    HPI  Jessica Morgan is here to follow up on anxiety and a positive home pregnancy test.  She states that she is doing well, however she is slightly more stressed and anxious with her positive pregnancy test at home.  She states that her last menstrual period was in May.  She did have a colposcopy done in June with a negative pregnancy test there.  She states that she was using the Xulane patch, however she did not tolerate it so she stopped using this.  She denies nausea breast tenderness.  She is starting to urinate frequently.  She has been talking with her significant other and is leaning towards termination.     08/21/2022   10:51 AM 05/22/2022    9:31 AM 01/30/2022    3:38 PM  Depression screen PHQ 2/9  Decreased Interest 0 0 1  Down, Depressed, Hopeless 2 1 1   PHQ - 2 Score 2 1 2   Altered sleeping 2 2 3   Tired, decreased energy 1 0 2  Change in appetite 1 0 3  Feeling bad or failure about yourself  1 1 2   Trouble concentrating 2 0 0  Moving slowly or fidgety/restless 0 0 1  Suicidal thoughts 0 0 0  PHQ-9 Score 9 4 13   Difficult doing work/chores Somewhat difficult Somewhat difficult Somewhat difficult      08/21/2022   10:53 AM 05/22/2022    9:32 AM 01/30/2022    3:38 PM  GAD 7 : Generalized Anxiety Score  Nervous, Anxious, on Edge 3 1 1   Control/stop worrying 2 1 1   Worry too much - different things 3 1 1   Trouble relaxing 3 1 0  Restless 2 1 0  Easily annoyed or irritable 0 1 1  Afraid - awful might happen 0 0 1  Total GAD 7 Score 13 6 5   Anxiety Difficulty Somewhat difficult Somewhat difficult Somewhat difficult    ROS See pertinent positives and negatives per HPI.    Objective:     BP 108/70 (BP  Location: Left Arm)   Pulse 79   Temp (!) 97.5 F (36.4 C)   Ht 5\' 2"  (1.575 m)   Wt 145 lb (65.8 kg)   LMP 06/19/2022 (Approximate)   SpO2 99%   BMI 26.52 kg/m     Physical Exam Vitals and nursing note reviewed.  Constitutional:      General: She is not in acute distress.    Appearance: Normal appearance.  HENT:     Head: Normocephalic.  Eyes:     Conjunctiva/sclera: Conjunctivae normal.  Cardiovascular:     Rate and Rhythm: Normal rate and regular rhythm.     Pulses: Normal pulses.     Heart sounds: Normal heart sounds.  Pulmonary:     Effort: Pulmonary effort is normal.     Breath sounds: Normal breath sounds.  Abdominal:     Palpations: Abdomen is soft.     Tenderness: There is no abdominal tenderness.  Musculoskeletal:     Cervical back: Normal range of motion.  Skin:    General: Skin is warm.  Neurological:     General: No focal deficit present.  Mental Status: She is alert and oriented to person, place, and time.  Psychiatric:        Mood and Affect: Mood normal.        Behavior: Behavior normal.        Thought Content: Thought content normal.        Judgment: Judgment normal.      Assessment & Plan:   Problem List Items Addressed This Visit       Other   Anxiety and depression    Chronic, stable.  She is having some more stress and anxiety with her recent positive pregnancy test.  She has not heard from the therapist to schedule, information given to patient to call herself.  Follow-up in 3 months or sooner with concerns.      Encounter for pre-exposure prophylaxis for HIV    Continue Truvada daily.  Will check CMP and HIV today.      Relevant Orders   Comprehensive metabolic panel   HIV Antibody (routine testing w rflx)   Birth control counseling    Discussed starting a different birth control option either after termination or completed pregnancy.  She may be interested in starting the pill.  Encouraged her to reach out after following up  with GYN or [facility]      Missed period - Primary    Home pregnancy test positive. Will check beta Hcg per request. Start prenatal vitamin daily. Info given on planned parenthood and [facility]. Discussed abortion laws in [place] need to be done before 12 weeks pregnancy.       Relevant Orders   Beta hCG quant (ref lab)    Return in about 3 months (around 11/21/2022) for PREP.    Gerre Scull, NP

## 2022-08-21 NOTE — Patient Instructions (Signed)
It was great to see you!  You were referred to [facility] of the [place] for therapy:  [phone]  We are checking your labs today and will let you know the results via mychart/phone.   You can call OB/GYN if you would like to continue with pregnancy start prenatal vitamin daily: [facility] for [facility]  [phone]  In [place] your have up to 12 weeks to terminate pregnancy. Keep appointment next week or reach out to planned parenthood or [facility]  Let's follow-up in 3 months, sooner if you have concerns.  If a referral was placed today, you will be contacted for an appointment. Please note that routine referrals can sometimes take up to 3-4 weeks to process. Please call our office if you haven't heard anything after this time frame.  Take care,  Rodman Pickle, NP

## 2022-08-21 NOTE — Assessment & Plan Note (Signed)
Chronic, stable.  She is having some more stress and anxiety with her recent positive pregnancy test.  She has not heard from the therapist to schedule, information given to patient to call herself.  Follow-up in 3 months or sooner with concerns.

## 2022-08-22 ENCOUNTER — Encounter: Payer: Self-pay | Admitting: Nurse Practitioner

## 2022-08-22 LAB — HIV ANTIBODY (ROUTINE TESTING W REFLEX): HIV Screen 4th Generation wRfx: NONREACTIVE

## 2022-08-22 LAB — BETA HCG QUANT (REF LAB): hCG Quant: 3823 m[IU]/mL

## 2023-03-28 ENCOUNTER — Encounter: Payer: Self-pay | Admitting: Family Medicine

## 2023-03-28 ENCOUNTER — Ambulatory Visit: Payer: Medicaid Other | Admitting: Family Medicine

## 2023-03-28 ENCOUNTER — Other Ambulatory Visit (HOSPITAL_COMMUNITY)
Admission: RE | Admit: 2023-03-28 | Discharge: 2023-03-28 | Disposition: A | Discharge status: Home / Self Care | Payer: Medicaid Other | Source: Ambulatory Visit | Attending: Family Medicine | Admitting: Family Medicine

## 2023-03-28 VITALS — BP 111/76 | HR 87 | Temp 98.1°F | Resp 18 | Wt 153.0 lb

## 2023-03-28 DIAGNOSIS — N390 Urinary tract infection, site not specified: Secondary | ICD-10-CM | POA: Diagnosis not present

## 2023-03-28 DIAGNOSIS — Z7721 Contact with and (suspected) exposure to potentially hazardous body fluids: Secondary | ICD-10-CM

## 2023-03-28 DIAGNOSIS — R350 Frequency of micturition: Secondary | ICD-10-CM | POA: Diagnosis not present

## 2023-03-28 DIAGNOSIS — N898 Other specified noninflammatory disorders of vagina: Secondary | ICD-10-CM | POA: Insufficient documentation

## 2023-03-28 LAB — POC URINALSYSI DIPSTICK (AUTOMATED)
Bilirubin, UA: NEGATIVE
Blood, UA: NEGATIVE
Glucose, UA: NEGATIVE
Ketones, UA: NEGATIVE
Leukocytes, UA: NEGATIVE
Nitrite, UA: NEGATIVE
Protein, UA: NEGATIVE
Spec Grav, UA: 1.015 (ref 1.010–1.025)
Urobilinogen, UA: 0.2 U/dL — AB
pH, UA: 6 (ref 5.0–8.0)

## 2023-03-28 LAB — URINALYSIS, ROUTINE W REFLEX MICROSCOPIC
Bilirubin Urine: NEGATIVE
Hgb urine dipstick: NEGATIVE
Ketones, ur: NEGATIVE
Nitrite: POSITIVE — AB
RBC / HPF: NONE SEEN (ref 0–?)
Specific Gravity, Urine: 1.01 (ref 1.000–1.030)
Total Protein, Urine: NEGATIVE
Urine Glucose: NEGATIVE
Urobilinogen, UA: 0.2 (ref 0.0–1.0)
pH: 6 (ref 5.0–8.0)

## 2023-03-28 LAB — POCT URINE PREGNANCY: Preg Test, Ur: NEGATIVE

## 2023-03-28 MED ORDER — METRONIDAZOLE 0.75 % EX GEL: 1.0000 | Freq: Two times a day (BID) | CUTANEOUS | 0 refills | Status: DC

## 2023-03-28 NOTE — Progress Notes (Signed)
Established Patient Office Visit   Subjective:  Patient ID: Jessica Morgan, female    DOB: [birth year]  Age: 38 y.o. MRN: [id number]  Chief Complaint  Patient presents with   Acute Visit    Pt C/O of vaginal discharge very bloody and white discharge with foul smell present for 2-3 days no burning sensation; pelvis pressure Pt had sexual intercourse on 03/25/2023.     HPI Encounter Diagnoses  Name Primary?   Exposure to blood or body fluid Yes   Urinary tract infection without hematuria, site unspecified    Urinary frequency    Vaginal discharge    Reports a 2-day history of a malodorous vaginal whitish discharge.  There was intercourse 2 days prior to discharge.  Currently taking no form of contraception.  LMP 2/1.  There is urinary urgency without frequency or dysuria.  There is no vaginal pruritus.  She is on PrEP with Truvada and tolerating it.  Currently taking a compounded GLP-1 agonist semaglutide.   Review of Systems  Constitutional: Negative.   HENT: Negative.    Eyes:  Negative for blurred vision, discharge and redness.  Respiratory: Negative.    Cardiovascular: Negative.   Gastrointestinal:  Negative for abdominal pain.  Genitourinary:  Positive for urgency. Negative for dysuria, flank pain, frequency and hematuria.  Musculoskeletal: Negative.  Negative for myalgias.  Skin:  Negative for rash.  Neurological:  Negative for tingling, loss of consciousness and weakness.  Endo/Heme/Allergies:  Negative for polydipsia.     Current Outpatient Medications:    emtricitabine-tenofovir (TRUVADA) 200-300 MG tablet, Take 1 tablet by mouth daily., Disp: 90 tablet, Rfl: 0   metroNIDAZOLE (METROGEL) 0.75 % gel, Apply 1 Application topically 2 (two) times daily., Disp: 45 g, Rfl: 0   Objective:     BP 111/76 (BP Location: Left Arm, Patient Position: Sitting, Cuff Size: Large)   Pulse 87   Temp 98.1 F (36.7 C) (Temporal)   Resp 18   Wt 153 lb (69.4 kg)   LMP 03/08/2023  (Exact Date)   SpO2 100%   BMI 27.98 kg/m    Physical Exam Constitutional:      General: She is not in acute distress.    Appearance: Normal appearance. She is not ill-appearing, toxic-appearing or diaphoretic.  HENT:     Head: Normocephalic and atraumatic.     Right Ear: External ear normal.     Left Ear: External ear normal.  Eyes:     General: No scleral icterus.       Right eye: No discharge.        Left eye: No discharge.     Extraocular Movements: Extraocular movements intact.     Conjunctiva/sclera: Conjunctivae normal.  Cardiovascular:     Rate and Rhythm: Normal rate and regular rhythm.  Pulmonary:     Effort: Pulmonary effort is normal. No respiratory distress.     Breath sounds: No wheezing, rhonchi or rales.  Abdominal:     General: Bowel sounds are normal.     Tenderness: There is no right CVA tenderness or left CVA tenderness.  Skin:    General: Skin is warm and dry.  Neurological:     Mental Status: She is alert and oriented to person, place, and time.  Psychiatric:        Mood and Affect: Mood normal.        Behavior: Behavior normal.      Results for orders placed or performed in visit on 03/28/23  POCT Urinalysis  Dipstick (Automated)  Result Value Ref Range   Color, UA light yellow    Clarity, UA clear    Glucose, UA Negative Negative   Bilirubin, UA negative    Ketones, UA negative    Spec Grav, UA 1.015 1.010 - 1.025   Blood, UA negative    pH, UA 6.0 5.0 - 8.0   Protein, UA Negative Negative   Urobilinogen, UA 0.2 (A) 0.2 or 1.0 E.U./dL   Nitrite, UA negative    Leukocytes, UA Negative Negative  POCT urine pregnancy  Result Value Ref Range   Preg Test, Ur Negative Negative      The ASCVD Risk score (Arnett DK, et al., 2019) failed to calculate for the following reasons:   The 2019 ASCVD risk score is only valid for ages 80 to 26    Assessment & Plan:   Exposure to blood or body fluid -     Urine cytology ancillary only -      RPR -     HIV Antibody (routine testing w rflx) -     POCT urine pregnancy  Urinary tract infection without hematuria, site unspecified -     POCT Urinalysis Dipstick (Automated)  Urinary frequency -     Urinalysis, Routine w reflex microscopic  Vaginal discharge -     Urine cytology ancillary only -     POCT Pregnancy, Urine -     metroNIDAZOLE; Apply 1 Application topically 2 (two) times daily.  Dispense: 45 g; Refill: 0    Return Schedule follow-up with Lauren in a few weeks.  Workup pending.  Information given on safe sex.  Mliss Sax, MD

## 2023-03-29 LAB — HIV ANTIBODY (ROUTINE TESTING W REFLEX): HIV 1&2 Ab, 4th Generation: NONREACTIVE

## 2023-03-29 LAB — RPR: RPR Ser Ql: NONREACTIVE

## 2023-03-31 ENCOUNTER — Telehealth: Payer: Self-pay

## 2023-03-31 DIAGNOSIS — N898 Other specified noninflammatory disorders of vagina: Secondary | ICD-10-CM

## 2023-03-31 LAB — URINE CYTOLOGY ANCILLARY ONLY
Chlamydia: NEGATIVE
Comment: NEGATIVE
Comment: NEGATIVE
Comment: NORMAL
Neisseria Gonorrhea: NEGATIVE
Trichomonas: NEGATIVE

## 2023-03-31 MED ORDER — DOXYCYCLINE HYCLATE 100 MG PO TABS: 100.0000 mg | ORAL_TABLET | Freq: Two times a day (BID) | ORAL | 0 refills | Status: AC

## 2023-03-31 NOTE — Addendum Note (Signed)
 Addended by: Andrez Grime on: 03/31/2023 11:56 AM   Modules accepted: Orders

## 2023-03-31 NOTE — Telephone Encounter (Signed)
 Corrie Dandy called with a critical lab stating the urinalysis could not test for Gardnerella and Candida and patient should return for testing.

## 2023-03-31 NOTE — Addendum Note (Signed)
 Addended by: Solon Palm on: 03/31/2023 01:46 PM   Modules accepted: Orders

## 2023-03-31 NOTE — Addendum Note (Signed)
 Addended by: Andrez Grime on: 03/31/2023 08:29 AM   Modules accepted: Orders

## 2023-03-31 NOTE — Telephone Encounter (Signed)
 Spoke to pt and scheduled lab visit for 2/27 9:20am for self swab BV/yeast.

## 2023-04-03 ENCOUNTER — Telehealth: Payer: Self-pay

## 2023-04-03 ENCOUNTER — Other Ambulatory Visit: Payer: Medicaid Other

## 2023-04-03 ENCOUNTER — Other Ambulatory Visit (HOSPITAL_COMMUNITY)
Admission: RE | Admit: 2023-04-03 | Discharge: 2023-04-03 | Disposition: A | Discharge status: Home / Self Care | Payer: Medicaid Other | Source: Ambulatory Visit | Attending: Nurse Practitioner | Admitting: Nurse Practitioner

## 2023-04-03 DIAGNOSIS — N898 Other specified noninflammatory disorders of vagina: Secondary | ICD-10-CM | POA: Insufficient documentation

## 2023-04-03 NOTE — Telephone Encounter (Signed)
 [facility] called and said that a urine cytology was received to test for Candida and Gardnerella and they do not test for that there. They can test for Trich, Chlamydia, and Gonorrhea.

## 2023-04-03 NOTE — Addendum Note (Signed)
 Addended by: Larey Dresser on: 04/03/2023 05:01 PM   Modules accepted: Orders

## 2023-04-04 ENCOUNTER — Other Ambulatory Visit: Payer: Self-pay

## 2023-04-04 LAB — CERVICOVAGINAL ANCILLARY ONLY
Bacterial Vaginitis (gardnerella): POSITIVE — AB
Candida Glabrata: NEGATIVE
Candida Vaginitis: NEGATIVE
Chlamydia: NEGATIVE
Comment: NEGATIVE
Comment: NEGATIVE
Comment: NEGATIVE
Comment: NEGATIVE
Comment: NORMAL
Neisseria Gonorrhea: NEGATIVE

## 2023-04-25 ENCOUNTER — Ambulatory Visit: Payer: Medicaid Other | Admitting: Nurse Practitioner

## 2023-05-15 ENCOUNTER — Ambulatory Visit: Payer: Medicaid Other | Admitting: Nurse Practitioner

## 2023-06-12 ENCOUNTER — Encounter: Payer: Self-pay | Admitting: Nurse Practitioner

## 2023-06-12 ENCOUNTER — Other Ambulatory Visit (HOSPITAL_COMMUNITY)
Admission: RE | Admit: 2023-06-12 | Discharge: 2023-06-12 | Disposition: A | Discharge status: Home / Self Care | Source: Ambulatory Visit | Attending: Nurse Practitioner | Admitting: Nurse Practitioner

## 2023-06-12 ENCOUNTER — Ambulatory Visit: Admitting: Nurse Practitioner

## 2023-06-12 VITALS — BP 110/70 | HR 73 | Temp 97.3°F | Ht 62.0 in | Wt 143.8 lb

## 2023-06-12 DIAGNOSIS — Z0000 Encounter for general adult medical examination without abnormal findings: Secondary | ICD-10-CM

## 2023-06-12 DIAGNOSIS — K5903 Drug induced constipation: Secondary | ICD-10-CM | POA: Diagnosis not present

## 2023-06-12 DIAGNOSIS — R569 Unspecified convulsions: Secondary | ICD-10-CM

## 2023-06-12 DIAGNOSIS — D649 Anemia, unspecified: Secondary | ICD-10-CM

## 2023-06-12 DIAGNOSIS — E663 Overweight: Secondary | ICD-10-CM

## 2023-06-12 DIAGNOSIS — Z124 Encounter for screening for malignant neoplasm of cervix: Secondary | ICD-10-CM | POA: Insufficient documentation

## 2023-06-12 DIAGNOSIS — Z23 Encounter for immunization: Secondary | ICD-10-CM | POA: Diagnosis not present

## 2023-06-12 DIAGNOSIS — Z2981 Encounter for HIV pre-exposure prophylaxis: Secondary | ICD-10-CM

## 2023-06-12 NOTE — Assessment & Plan Note (Signed)
She has history of anemia, will check CBC, iron panel, vitamin B12 today.

## 2023-06-12 NOTE — Assessment & Plan Note (Signed)
Chronic, stable.  She has not had a seizure recently and is following with neurology.

## 2023-06-12 NOTE — Progress Notes (Signed)
 BP 110/70 (BP Location: Left Arm, Patient Position: Sitting, Cuff Size: Normal)   Pulse 73   Temp (!) 97.3 F (36.3 C)   Ht 5\' 2"  (1.575 m)   Wt 143 lb 12.8 oz (65.2 kg)   LMP 06/05/2023 (Exact Date)   SpO2 99%   BMI 26.30 kg/m    Subjective:    Patient ID: Jessica Morgan, female    DOB: 10-24-1985, 38 y.o.   MRN: [id number]  CC: Chief Complaint  Patient presents with   Constipation    For 1 month and overall check up    HPI: Jessica Morgan is a 38 y.o. female presenting on 06/12/2023 for comprehensive medical examination. Current medical complaints include:constipation  She recently started semaglutide injections and has been experiencing constipation. She has been taking miralax 2-3 times a week and recently took a couple magesium glycinate pills. She denies nausea and vomiting. She drinks about 3-4 16 ounce water bottles daily. She has been exercising 4-5 times per week.   She currently lives with: son, mom, brother Menopausal Symptoms: no  Depression and Anxiety Screen done today and results listed below:     06/12/2023    3:03 PM 08/21/2022   10:51 AM 05/22/2022    9:31 AM 01/30/2022    3:38 PM  Depression screen PHQ 2/9  Decreased Interest 0 0 0 1  Down, Depressed, Hopeless 0 2 1 1   PHQ - 2 Score 0 2 1 2   Altered sleeping  2 2 3   Tired, decreased energy  1 0 2  Change in appetite  1 0 3  Feeling bad or failure about yourself   1 1 2   Trouble concentrating  2 0 0  Moving slowly or fidgety/restless  0 0 1  Suicidal thoughts  0 0 0  PHQ-9 Score  9 4 13   Difficult doing work/chores  Somewhat difficult Somewhat difficult Somewhat difficult      08/21/2022   10:53 AM 05/22/2022    9:32 AM 01/30/2022    3:38 PM  GAD 7 : Generalized Anxiety Score  Nervous, Anxious, on Edge 3 1 1   Control/stop worrying 2 1 1   Worry too much - different things 3 1 1   Trouble relaxing 3 1 0  Restless 2 1 0  Easily annoyed or irritable 0 1 1  Afraid - awful might happen 0 0 1  Total  GAD 7 Score 13 6 5   Anxiety Difficulty Somewhat difficult Somewhat difficult Somewhat difficult    The patient does not have a history of falls. I did not complete a risk assessment for falls. A plan of care for falls was not documented.   Past Medical History:  Past Medical History:  Diagnosis Date   Anemia    Depression 02/14/20   Still grieving with my father's death and dealing with stressors of life   Headache(784.0)    Hx of chlamydia infection    Seizure (HCC)     Surgical History:  History reviewed. No pertinent surgical history.  Medications:  Current Outpatient Medications on File Prior to Visit  Medication Sig   emtricitabine -tenofovir  (TRUVADA) 200-300 MG tablet Take 1 tablet by mouth daily.   SEMAGLUTIDE-WEIGHT MANAGEMENT [place] Inject into the skin every 7 (seven) days.   No current facility-administered medications on file prior to visit.    Allergies:  No Known Allergies  Social History:  Social History   Socioeconomic History   Marital status: Single    Spouse name: Not on file  Number of children: 1   Years of education: Not on file   Highest education level: GED or equivalent  Occupational History   Not on file  Tobacco Use   Smoking status: Former    Current packs/day: 0.00    Average packs/day: 1 pack/day for 5.0 years (5.0 ttl pk-yrs)    Types: Cigarettes    Start date: 09/30/2009    Quit date: 10/01/2014    Years since quitting: 8.7   Smokeless tobacco: Never  Vaping Use   Vaping status: Some Days  Substance and Sexual Activity   Alcohol use: Yes    Alcohol/week: 3.0 standard drinks of alcohol    Types: 3 Glasses of wine per week    Comment: occasional   Drug use: Not Currently    Frequency: 1.0 times per week    Types: Marijuana   Sexual activity: Yes    Birth control/protection: Condom  Other Topics Concern   Not on file  Social History Narrative   ** Merged History Encounter **    Are you right handed or left handed?  Right     Are you currently employed ? no   What is your current occupation?   Do you live at home alone? no   Who lives with you? Family    What type of home do you live in: 1 story or 2 story?  1 story        Social Drivers of Corporate investment banker Strain: Medium Risk (08/20/2022)   Overall Financial Resource Strain (CARDIA)    Difficulty of Paying Living Expenses: Somewhat hard  Food Insecurity: Food Insecurity Present (08/20/2022)   Hunger Vital Sign    Worried About Running Out of Food in the Last Year: Sometimes true    Ran Out of Food in the Last Year: Sometimes true  Transportation Needs: No Transportation Needs (08/20/2022)   PRAPARE - Administrator, Civil Service (Medical): No    Lack of Transportation (Non-Medical): No  Physical Activity: Insufficiently Active (08/20/2022)   Exercise Vital Sign    Days of Exercise per Week: 3 days    Minutes of Exercise per Session: 40 min  Stress: Stress Concern Present (08/20/2022)   [employer] of Occupational Health - Occupational Stress Questionnaire    Feeling of Stress : Very much  Social Connections: Unknown (08/20/2022)   Social Connection and Isolation Panel [NHANES]    Frequency of Communication with Friends and Family: Once a week    Frequency of Social Gatherings with Friends and Family: Once a week    Attends Religious Services: Never    Database administrator or Organizations: No    Attends Engineer, structural: Not on file    Marital Status: Patient declined  Catering manager Violence: Not on file   Social History   Tobacco Use  Smoking Status Former   Current packs/day: 0.00   Average packs/day: 1 pack/day for 5.0 years (5.0 ttl pk-yrs)   Types: Cigarettes   Start date: 09/30/2009   Quit date: 10/01/2014   Years since quitting: 8.7  Smokeless Tobacco Never   Social History   Substance and Sexual Activity  Alcohol Use Yes   Alcohol/week: 3.0 standard drinks of alcohol   Types: 3 Glasses  of wine per week   Comment: occasional    Family History:  Family History  Problem Relation Age of Onset   Depression Mother    Hypertension Father    Diabetes  Father    COPD Father     Past medical history, surgical history, medications, allergies, family history and social history reviewed with patient today and changes made to appropriate areas of the chart.   Review of Systems  Constitutional: Negative.   HENT: Negative.    Eyes: Negative.   Respiratory: Negative.    Cardiovascular: Negative.   Gastrointestinal:  Positive for constipation. Negative for blood in stool, diarrhea, heartburn, nausea and vomiting.  Genitourinary: Negative.   Musculoskeletal: Negative.   Skin: Negative.   Neurological: Negative.   Psychiatric/Behavioral: Negative.     All other ROS negative except what is listed above and in the HPI.      Objective:     BP 110/70 (BP Location: Left Arm, Patient Position: Sitting, Cuff Size: Normal)   Pulse 73   Temp (!) 97.3 F (36.3 C)   Ht 5\' 2"  (1.575 m)   Wt 143 lb 12.8 oz (65.2 kg)   LMP 06/05/2023 (Exact Date)   SpO2 99%   BMI 26.30 kg/m   Wt Readings from Last 3 Encounters:  06/12/23 143 lb 12.8 oz (65.2 kg)  03/28/23 153 lb (69.4 kg)  08/21/22 145 lb (65.8 kg)    Physical Exam Vitals and nursing note reviewed. Exam conducted with a chaperone present.  Constitutional:      General: She is not in acute distress.    Appearance: Normal appearance.  HENT:     Head: Normocephalic and atraumatic.     Right Ear: Tympanic membrane, ear canal and external ear normal.     Left Ear: Tympanic membrane, ear canal and external ear normal.  Eyes:     Conjunctiva/sclera: Conjunctivae normal.  Cardiovascular:     Rate and Rhythm: Normal rate and regular rhythm.     Pulses: Normal pulses.     Heart sounds: Normal heart sounds.  Pulmonary:     Effort: Pulmonary effort is normal.     Breath sounds: Normal breath sounds.  Abdominal:     Palpations:  Abdomen is soft.     Tenderness: There is no abdominal tenderness.  Genitourinary:    General: Normal vulva.     Exam position: Lithotomy position.     Labia:        Right: No rash, tenderness or lesion.        Left: No rash, tenderness or lesion.      Vagina: Normal.     Cervix: Normal.     Uterus: Normal.      Adnexa: Right adnexa normal and left adnexa normal.  Musculoskeletal:        General: Normal range of motion.     Cervical back: Normal range of motion and neck supple.     Right lower leg: No edema.     Left lower leg: No edema.  Lymphadenopathy:     Cervical: No cervical adenopathy.  Skin:    General: Skin is warm and dry.  Neurological:     General: No focal deficit present.     Mental Status: She is alert and oriented to person, place, and time.     Cranial Nerves: No cranial nerve deficit.     Coordination: Coordination normal.     Gait: Gait normal.  Psychiatric:        Mood and Affect: Mood normal.        Behavior: Behavior normal.        Thought Content: Thought content normal.  Judgment: Judgment normal.     Results for orders placed or performed in visit on 04/03/23  Cervicovaginal ancillary only   Collection Time: 04/03/23  9:12 AM  Result Value Ref Range   Neisseria Gonorrhea Negative    Chlamydia Negative    Bacterial Vaginitis (gardnerella) Positive (A)    Candida Vaginitis Negative    Candida Glabrata Negative    Comment Normal Reference Range Candida Species - Negative    Comment Normal Reference Range Candida Galbrata - Negative    Comment Normal Reference [place] Chlamydia - Negative    Comment      Normal Reference Range Neisseria Gonorrhea - Negative   Comment      Normal Reference Range Bacterial Vaginosis - Negative      Assessment & Plan:   Problem List Items Addressed This Visit       Digestive   Drug-induced constipation   Most likely from semaglutide. Increase fluids to 64-80 daily. Can take miralax 1-2 times a day as  needed and continue regular exercise.         Other   ANEMIA   She has history of anemia, will check CBC, iron panel, vitamin B12 today.      Relevant Orders   CBC with Differential/Platelet   Ferritin   Iron   Vitamin B12   Seizures (HCC)   Chronic, stable.  She has not had a seizure recently and is following with neurology.      Encounter for pre-exposure prophylaxis for HIV   Continue Truvada daily.  Will check CMP and HIV today.      Relevant Orders   Comprehensive metabolic panel with GFR   HIV Antibody (routine testing w rflx)   Routine general medical examination at a health care facility - Primary   Health maintenance reviewed and updated. Discussed nutrition, exercise. Follow-up 1 year.        Overweight   BMI 26.3. She is taking semaglutide from another provider. Continue focus on healthy food choices and exercise.      Other Visit Diagnoses       Screening for cervical cancer       Last pap showed LSIL and HPV which colposcopy confirmed. Will repeat pap today.   Relevant Orders   Cytology - PAP     Immunization due       Tdap given today   Relevant Orders   Tdap vaccine greater than or equal to 7yo IM (Completed)        Follow up plan: Return in about 3 months (around 09/12/2023) for Truvada.   LABORATORY TESTING:  - Pap smear: pap done  IMMUNIZATIONS:   - Tdap: Tetanus vaccination status reviewed: Td vaccination indicated and given today. - Influenza: Postponed to flu season - Pneumovax: Not applicable - Prevnar: Not applicable - HPV: Not applicable - Shingrix vaccine: Not applicable  SCREENING: -Mammogram: Not applicable  - Colonoscopy: Not applicable  - Bone Density: Not applicable   PATIENT COUNSELING:   Advised to take 1 mg of folate supplement per day if capable of pregnancy.   Sexuality: Discussed sexually transmitted diseases, partner selection, use of condoms, avoidance of unintended pregnancy  and contraceptive alternatives.    Advised to avoid cigarette smoking.  I discussed with the patient that most people either abstain from alcohol or drink within safe limits (<=14/week and <=4 drinks/occasion for males, <=7/weeks and <= 3 drinks/occasion for females) and that the risk for alcohol disorders and other health effects rises proportionally  with the number of drinks per week and how often a drinker exceeds daily limits.  Discussed cessation/primary prevention of drug use and availability of treatment for abuse.   Diet: Encouraged to adjust caloric intake to maintain  or achieve ideal body weight, to reduce intake of dietary saturated fat and total fat, to limit sodium intake by avoiding high sodium foods and not adding table salt, and to maintain adequate dietary potassium and calcium preferably from fresh fruits, vegetables, and low-fat dairy products.    stressed the importance of regular exercise  Injury prevention: Discussed safety belts, safety helmets, smoke detector, smoking near bedding or upholstery.   Dental health: Discussed importance of regular tooth brushing, flossing, and dental visits.    NEXT PREVENTATIVE PHYSICAL DUE IN 1 YEAR. Return in about 3 months (around 09/12/2023) for Truvada.  Noam Karaffa A Toren Tucholski

## 2023-06-12 NOTE — Assessment & Plan Note (Signed)
 Most likely from semaglutide. Increase fluids to 64-80 daily. Can take miralax 1-2 times a day as needed and continue regular exercise.

## 2023-06-12 NOTE — Patient Instructions (Signed)
 It was great to see you!  We are checking your labs today and will let you know the results via mychart/phone.   Let me know if you need a refill on truvada  Let's follow-up in 3 months, sooner if you have concerns.  If a referral was placed today, you will be contacted for an appointment. Please note that routine referrals can sometimes take up to 3-4 weeks to process. Please call our office if you haven't heard anything after this time frame.  Take care,  Rheba Cedar, NP

## 2023-06-12 NOTE — Assessment & Plan Note (Signed)
Health maintenance reviewed and updated. Discussed nutrition, exercise. Follow-up 1 year.

## 2023-06-12 NOTE — Assessment & Plan Note (Signed)
Continue Truvada daily.  Will check CMP and HIV today.

## 2023-06-12 NOTE — Assessment & Plan Note (Signed)
 BMI 26.3. She is taking semaglutide from another provider. Continue focus on healthy food choices and exercise.

## 2023-06-13 ENCOUNTER — Encounter: Payer: Self-pay | Admitting: Nurse Practitioner

## 2023-06-13 LAB — CBC WITH DIFFERENTIAL/PLATELET
Basophils Absolute: 0 10*3/uL (ref 0.0–0.1)
Basophils Relative: 0.5 % (ref 0.0–3.0)
Eosinophils Absolute: 0 10*3/uL (ref 0.0–0.7)
Eosinophils Relative: 0.8 % (ref 0.0–5.0)
HCT: 33.5 % — ABNORMAL LOW (ref 36.0–46.0)
Hemoglobin: 10.1 g/dL — ABNORMAL LOW (ref 12.0–15.0)
Lymphocytes Relative: 22.1 % (ref 12.0–46.0)
Lymphs Abs: 1.3 10*3/uL (ref 0.7–4.0)
MCHC: 30 g/dL (ref 30.0–36.0)
MCV: 81.6 fl (ref 78.0–100.0)
Monocytes Absolute: 0.2 10*3/uL (ref 0.1–1.0)
Monocytes Relative: 2.8 % — ABNORMAL LOW (ref 3.0–12.0)
Neutro Abs: 4.2 10*3/uL (ref 1.4–7.7)
Neutrophils Relative %: 73.8 % (ref 43.0–77.0)
Platelets: 187 10*3/uL (ref 150.0–400.0)
RBC: 4.11 Mil/uL (ref 3.87–5.11)
RDW: 15.6 % — ABNORMAL HIGH (ref 11.5–15.5)
WBC: 5.7 10*3/uL (ref 4.0–10.5)

## 2023-06-13 LAB — COMPREHENSIVE METABOLIC PANEL WITH GFR
ALT: 9 U/L (ref 0–35)
AST: 17 U/L (ref 0–37)
Albumin: 4.6 g/dL (ref 3.5–5.2)
Alkaline Phosphatase: 31 U/L — ABNORMAL LOW (ref 39–117)
BUN: 10 mg/dL (ref 6–23)
CO2: 24 meq/L (ref 19–32)
Calcium: 9.5 mg/dL (ref 8.4–10.5)
Chloride: 102 meq/L (ref 96–112)
Creatinine, Ser: 0.75 mg/dL (ref 0.40–1.20)
GFR: 102.57 mL/min (ref >60.00)
Glucose, Bld: 52 mg/dL — ABNORMAL LOW (ref 70–99)
Potassium: 3.7 meq/L (ref 3.5–5.1)
Sodium: 136 meq/L (ref 135–145)
Total Bilirubin: 1.5 mg/dL — ABNORMAL HIGH (ref 0.2–1.2)
Total Protein: 7.6 g/dL (ref 6.0–8.3)

## 2023-06-13 LAB — VITAMIN B12: Vitamin B-12: 1306 pg/mL — ABNORMAL HIGH (ref 211–911)

## 2023-06-13 LAB — HIV ANTIBODY (ROUTINE TESTING W REFLEX): HIV 1&2 Ab, 4th Generation: NONREACTIVE

## 2023-06-13 LAB — IRON: Iron: 111 ug/dL (ref 42–145)

## 2023-06-13 LAB — FERRITIN: Ferritin: 10 ng/mL (ref 10.0–291.0)

## 2023-06-18 ENCOUNTER — Ambulatory Visit: Payer: Self-pay | Admitting: Family

## 2023-06-18 LAB — CYTOLOGY - PAP
Comment: NEGATIVE
Diagnosis: UNDETERMINED — AB
High risk HPV: NEGATIVE

## 2024-01-12 ENCOUNTER — Ambulatory Visit: Admitting: Nurse Practitioner

## 2024-01-12 ENCOUNTER — Encounter: Payer: Self-pay | Admitting: Nurse Practitioner

## 2024-01-12 ENCOUNTER — Ambulatory Visit: Payer: Self-pay | Admitting: Nurse Practitioner

## 2024-01-12 VITALS — BP 102/70 | HR 61 | Temp 97.6°F | Ht 62.0 in | Wt 127.8 lb

## 2024-01-12 DIAGNOSIS — Z2981 Encounter for HIV pre-exposure prophylaxis: Secondary | ICD-10-CM

## 2024-01-12 DIAGNOSIS — N912 Amenorrhea, unspecified: Secondary | ICD-10-CM | POA: Diagnosis not present

## 2024-01-12 DIAGNOSIS — N926 Irregular menstruation, unspecified: Secondary | ICD-10-CM | POA: Diagnosis not present

## 2024-01-12 LAB — COMPREHENSIVE METABOLIC PANEL WITH GFR
ALT: 12 U/L (ref 0–35)
AST: 16 U/L (ref 0–37)
Albumin: 4.6 g/dL (ref 3.5–5.2)
Alkaline Phosphatase: 45 U/L (ref 39–117)
BUN: 15 mg/dL (ref 6–23)
CO2: 26 meq/L (ref 19–32)
Calcium: 9.2 mg/dL (ref 8.4–10.5)
Chloride: 103 meq/L (ref 96–112)
Creatinine, Ser: 0.74 mg/dL (ref 0.40–1.20)
GFR: 103.8 mL/min (ref >60.00)
Glucose, Bld: 87 mg/dL (ref 70–99)
Potassium: 4.4 meq/L (ref 3.5–5.1)
Sodium: 137 meq/L (ref 135–145)
Total Bilirubin: 1.2 mg/dL (ref 0.2–1.2)
Total Protein: 7.8 g/dL (ref 6.0–8.3)

## 2024-01-12 LAB — HCG, QUANTITATIVE, PREGNANCY: Quantitative HCG: <0.6 m[IU]/mL

## 2024-01-12 MED ORDER — EMTRICITABINE-TENOFOVIR DF 200-300 MG PO TABS: 1.0000 | ORAL_TABLET | Freq: Every day | ORAL | 0 refills | Status: AC

## 2024-01-12 NOTE — Assessment & Plan Note (Signed)
 She continues Truvada daily without issues. Routine tests are due. The Truvada prescription has been refilled. Check CMP and HIV today. She will come back in 3 months for labs and 6 months for office visit.

## 2024-01-12 NOTE — Progress Notes (Signed)
 Acute Office Visit  Subjective:     Patient ID: Jessica Morgan, female    DOB: 07/05/1985, 38 y.o.   MRN: [id number]  Chief Complaint  Patient presents with   Amenorrhea    LMP 11-27-23, bloated, slight fatigue, spotting and started bleeding yesterday    HPI Discussed the use of AI scribe software for clinical note transcription with the patient, who gave verbal consent to proceed.  History of Present Illness   Jessica Morgan is a 38 year old female who presents with irregular menstrual bleeding and spotting.  She expected her period around November 21-22 but had intermittent spotting instead. Yesterday the spotting increased, and today bleeding resembles a period but is lighter than usual. She feels bloated, fatigued, and has breast tenderness and increased urinary frequency, similar to prior menses.  She is sexually active without birth control. She took two home pregnancy tests, both negative. She takes Truvada daily without problems and is not taking prenatal vitamins or Wegovy. She notes recent increased exercise and stress.  She denies nausea, abdominal pain, constipation, and diarrhea.     ROS See pertinent positives and negatives per HPI.     Objective:    BP 102/70 (BP Location: Left Arm, Patient Position: Sitting, Cuff Size: Normal)   Pulse 61   Temp 97.6 F (36.4 C)   Ht 5' 2 (1.575 m)   Wt 127 lb 12.8 oz (58 kg)   LMP 11/27/2023 (Exact Date)   SpO2 99%   BMI 23.37 kg/m    Physical Exam Vitals and nursing note reviewed.  Constitutional:      General: She is not in acute distress.    Appearance: Normal appearance.  HENT:     Head: Normocephalic.  Eyes:     Conjunctiva/sclera: Conjunctivae normal.  Cardiovascular:     Rate and Rhythm: Normal rate and regular rhythm.     Pulses: Normal pulses.     Heart sounds: Normal heart sounds.  Pulmonary:     Effort: Pulmonary effort is normal.     Breath sounds: Normal breath sounds.  Abdominal:     Palpations:  Abdomen is soft.     Tenderness: There is no abdominal tenderness.  Musculoskeletal:     Cervical back: Normal range of motion.  Skin:    General: Skin is warm.  Neurological:     General: No focal deficit present.     Mental Status: She is alert and oriented to person, place, and time.  Psychiatric:        Mood and Affect: Mood normal.        Behavior: Behavior normal.        Thought Content: Thought content normal.        Judgment: Judgment normal.       Assessment & Plan:   Problem List Items Addressed This Visit       Other   Encounter for pre-exposure prophylaxis for HIV   She continues Truvada daily without issues. Routine tests are due. The Truvada prescription has been refilled. Check CMP and HIV today. She will come back in 3 months for labs and 6 months for office visit.       Relevant Orders   Comprehensive metabolic panel with GFR   HIV Antibody (routine testing w rflx)   Comprehensive metabolic panel with GFR   HIV Antibody (routine testing w rflx)   Missed period - Primary   She experiences irregular menstruation with negative home pregnancy tests. Possible contributors include  increased exercise, stress, or lifestyle changes. A blood test for pregnancy has been ordered. Results and further management will be discussed based on lab outcomes. Discussed starting birth control if pregnancy negative, however would like to hold off for now.       Relevant Orders   B-HCG Quant    Meds ordered this encounter  Medications   emtricitabine -tenofovir  (TRUVADA) 200-300 MG tablet    Sig: Take 1 tablet by mouth daily.    Dispense:  90 tablet    Refill:  0    ZERO refills remain on this prescription. Your patient is requesting advance approval of refills for this medication to PREVENT ANY MISSED DOSES    Return in about 6 months (around 07/12/2024) for prep.  Tinnie DELENA Harada, NP

## 2024-01-12 NOTE — Assessment & Plan Note (Addendum)
 She experiences irregular menstruation with negative home pregnancy tests. Possible contributors include increased exercise, stress, or lifestyle changes. A blood test for pregnancy has been ordered. Results and further management will be discussed based on lab outcomes. Discussed starting birth control if pregnancy negative, however would like to hold off for now.

## 2024-01-12 NOTE — Patient Instructions (Signed)
 It was great to see you!  We are checking your labs today and will let you know the results via mychart/phone.   Let's follow-up in 6 months, sooner if you have concerns.  If a referral was placed today, you will be contacted for an appointment. Please note that routine referrals can sometimes take up to 3-4 weeks to process. Please call our office if you haven't heard anything after this time frame.  Take care,  Rodman Pickle, NP

## 2024-01-13 LAB — HIV ANTIBODY (ROUTINE TESTING W REFLEX)
HIV 1&2 Ab, 4th Generation: NONREACTIVE
HIV FINAL INTERPRETATION: NEGATIVE

## 2024-01-30 ENCOUNTER — Encounter: Admitting: Nurse Practitioner
